# Patient Record
Sex: Female | Born: 1980 | Race: White | Hispanic: No | Marital: Single | Smoking: Former smoker
Health system: Southern US, Community
[De-identification: ages and names within clinical notes are randomized; demographics above are authoritative.]

## PROBLEM LIST (undated history)

## (undated) DIAGNOSIS — I10 Essential (primary) hypertension: Secondary | ICD-10-CM

---

## 2006-02-13 ENCOUNTER — Emergency Department: Payer: Self-pay | Admitting: Emergency Medicine

## 2007-09-01 ENCOUNTER — Emergency Department: Payer: Self-pay | Admitting: Emergency Medicine

## 2008-05-05 ENCOUNTER — Emergency Department: Payer: Self-pay | Admitting: Emergency Medicine

## 2009-01-26 IMAGING — CR DG CHEST 2V
1 series · 2 of 2 positions shown · non-contrast
Comparison: none

REASON FOR EXAM: cough, fever
COMMENTS:

PROCEDURE:     DXR - DXR CHEST PA (OR AP) AND LATERAL  - September 01, 2007  [DATE]
RESULT:     The lungs are clear. The heart and pulmonary vessels are normal.
The bony and mediastinal structures are unremarkable. There is no effusion.
There is no pneumothorax or evidence of congestive failure.

[Series 1: view not recorded · 0.17mm/px · 2 of 2 slices shown]
[im 1/2]
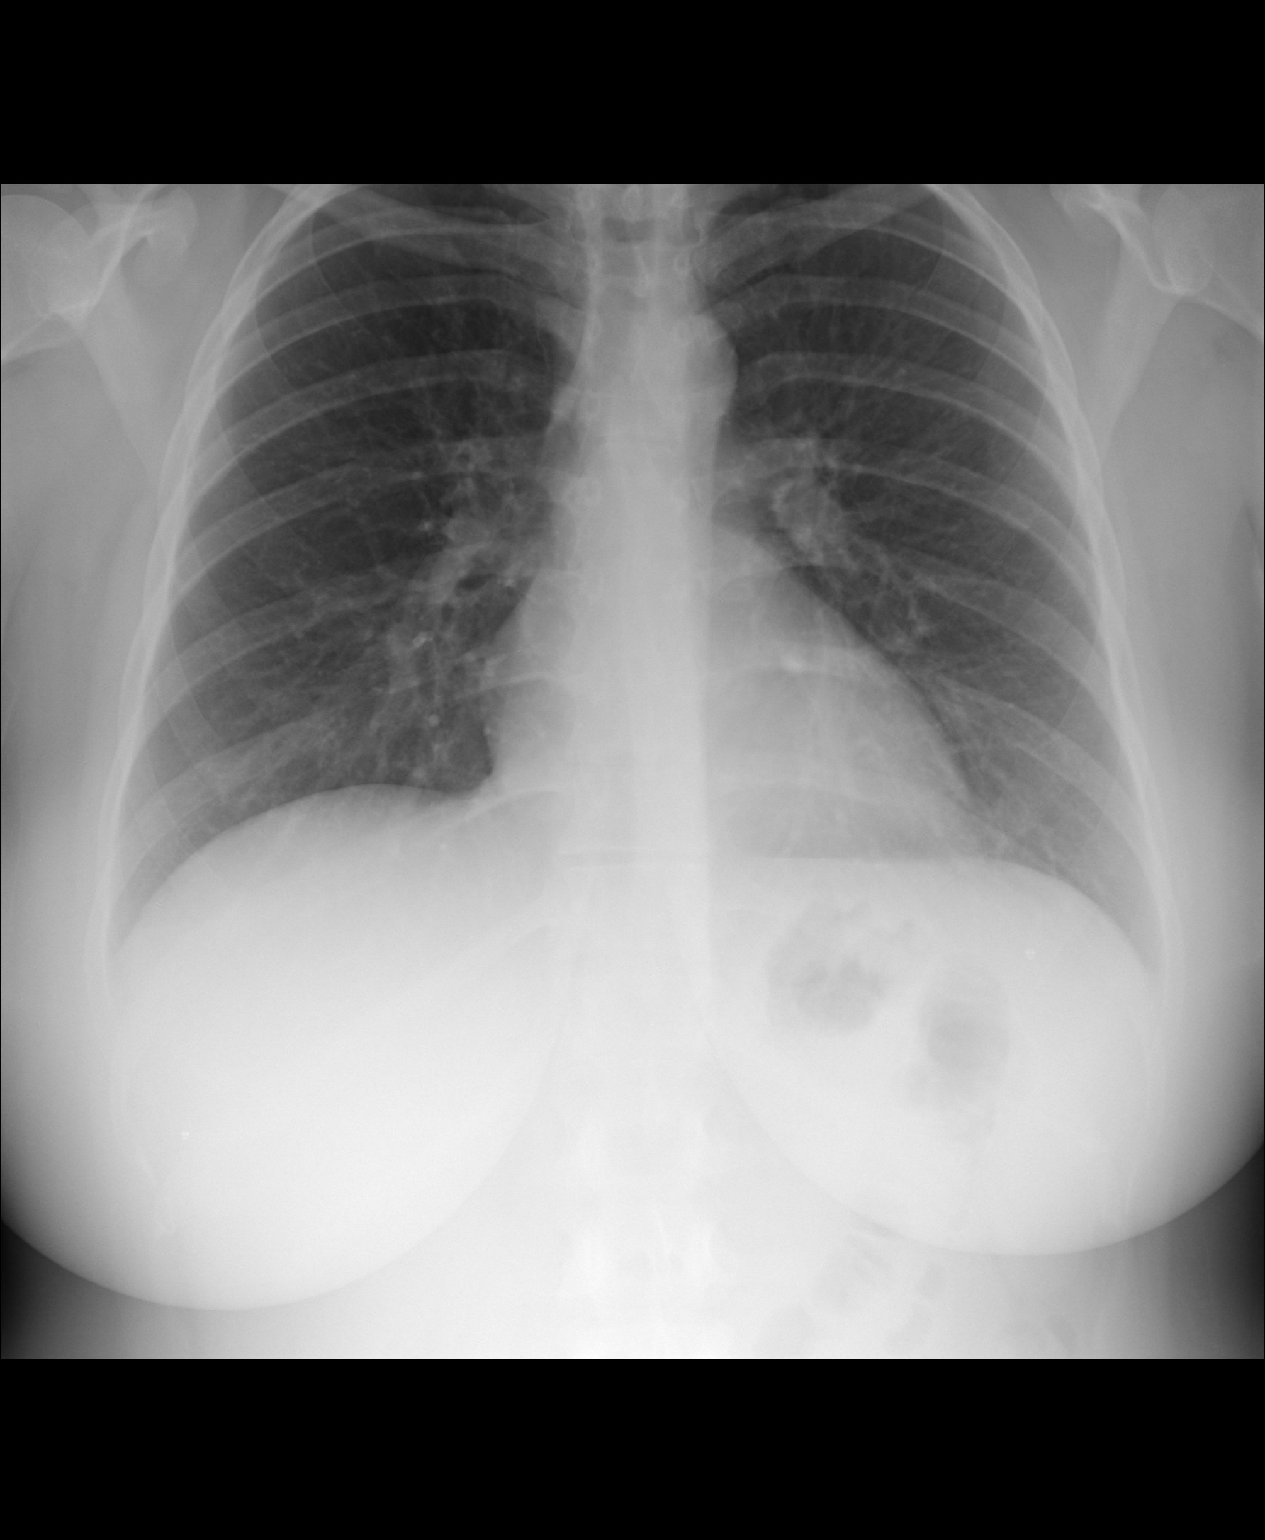
[im 2/2]
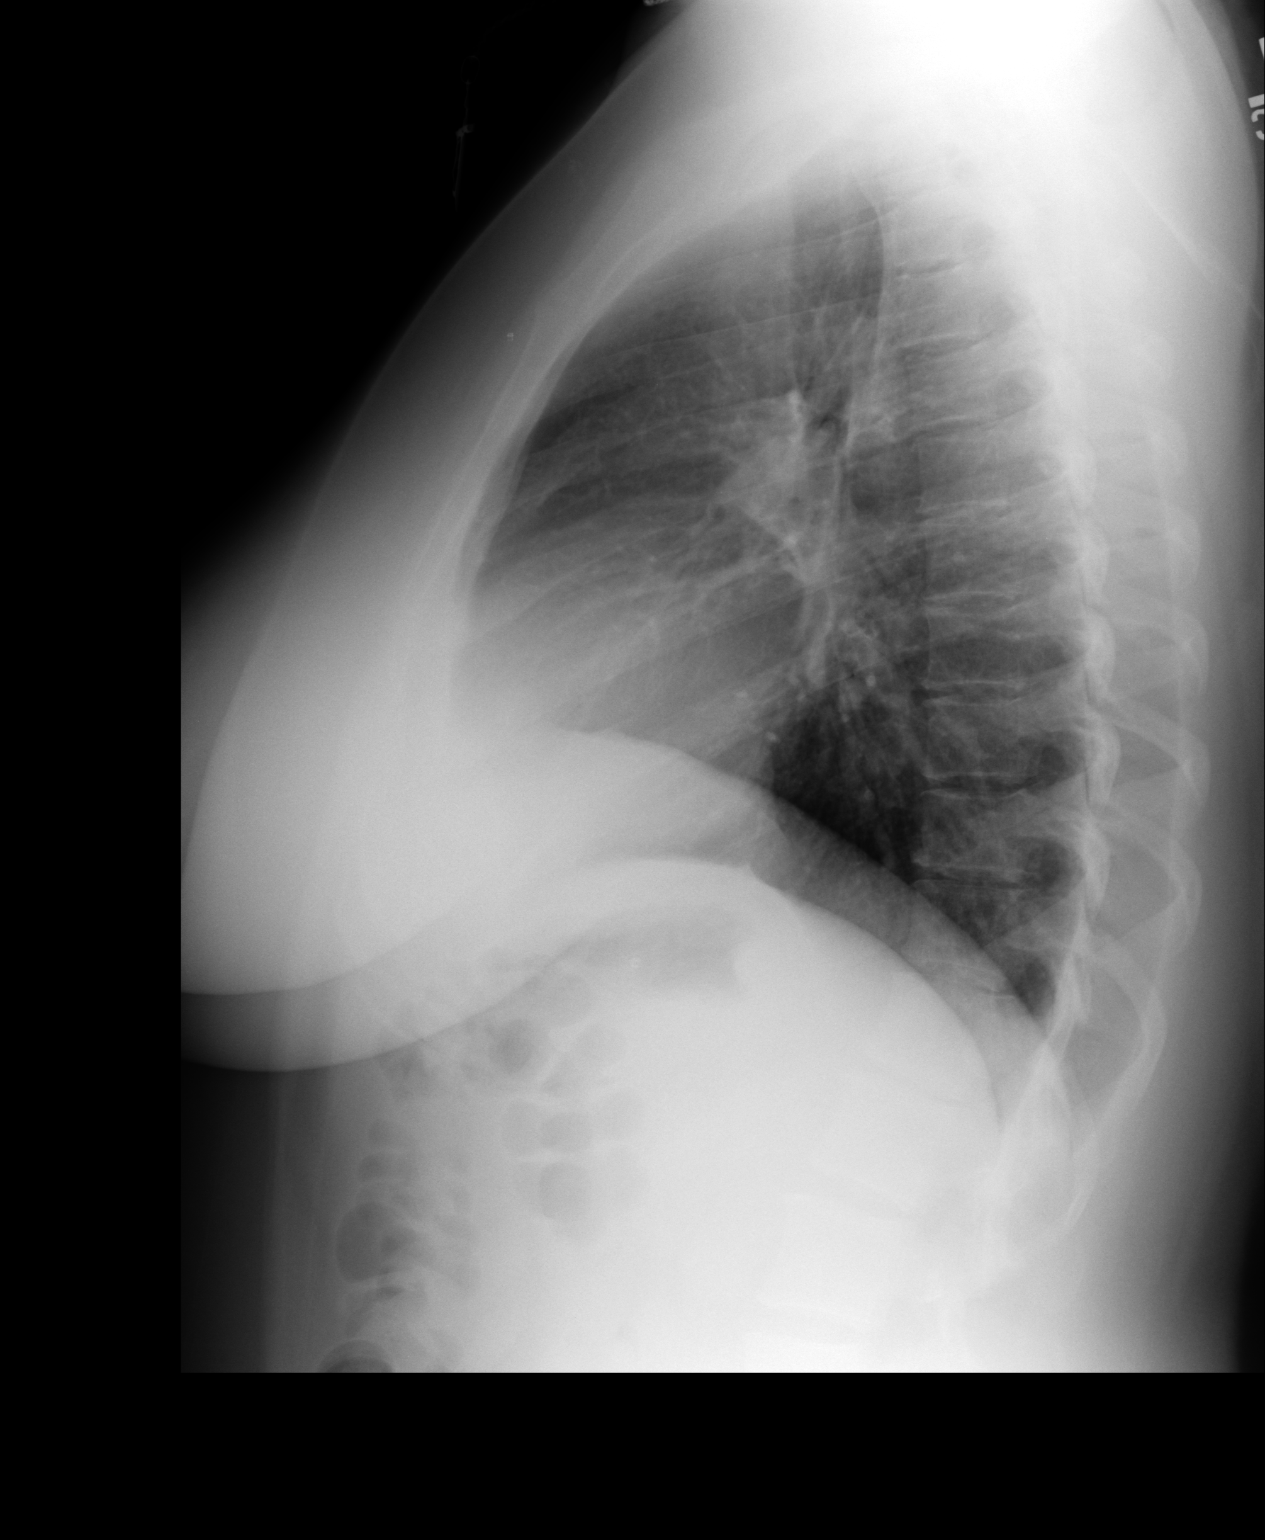

[2 of 2 positions shown; findings below may reference images not displayed]

IMPRESSION: No acute cardiopulmonary disease.

## 2011-11-30 ENCOUNTER — Emergency Department: Payer: Self-pay | Admitting: Emergency Medicine

## 2011-11-30 LAB — CBC
HGB: 12.9 g/dL (ref 12.0–16.0)
MCH: 28.7 pg (ref 26.0–34.0)
MCHC: 33.4 g/dL (ref 32.0–36.0)
MCV: 86 fL (ref 80–100)
Platelet: 304 10*3/uL (ref 150–440)
RBC: 4.49 10*6/uL (ref 3.80–5.20)
RDW: 13.4 % (ref 11.5–14.5)
WBC: 13.7 10*3/uL — ABNORMAL HIGH (ref 3.6–11.0)

## 2011-11-30 LAB — BASIC METABOLIC PANEL
Anion Gap: 13 (ref 7–16)
Co2: 24 mmol/L (ref 21–32)
Creatinine: 0.78 mg/dL (ref 0.60–1.30)
Osmolality: 286 (ref 275–301)
Potassium: 3.2 mmol/L — ABNORMAL LOW (ref 3.5–5.1)
Sodium: 143 mmol/L (ref 136–145)

## 2011-11-30 LAB — PREGNANCY, URINE: Pregnancy Test, Urine: NEGATIVE m[IU]/mL

## 2011-12-21 ENCOUNTER — Emergency Department: Payer: Self-pay | Admitting: Emergency Medicine

## 2011-12-25 ENCOUNTER — Ambulatory Visit: Payer: Self-pay | Admitting: General Practice

## 2011-12-25 LAB — PREGNANCY, URINE: Pregnancy Test, Urine: NEGATIVE m[IU]/mL

## 2011-12-25 LAB — SEDIMENTATION RATE: Erythrocyte Sed Rate: 12 mm/hr (ref 0–20)

## 2011-12-25 LAB — CBC
MCH: 29.3 pg (ref 26.0–34.0)
MCV: 86 fL (ref 80–100)
RBC: 4.68 10*6/uL (ref 3.80–5.20)

## 2013-02-15 ENCOUNTER — Emergency Department: Payer: Self-pay | Admitting: Emergency Medicine

## 2013-02-15 LAB — CBC WITH DIFFERENTIAL/PLATELET
Basophil #: 0 10*3/uL (ref 0.0–0.1)
HCT: 42.2 % (ref 35.0–47.0)
Lymphocyte #: 1.1 10*3/uL (ref 1.0–3.6)
MCH: 29.6 pg (ref 26.0–34.0)
MCHC: 34.9 g/dL (ref 32.0–36.0)
MCV: 85 fL (ref 80–100)
Monocyte %: 8 %
Neutrophil #: 6.5 10*3/uL (ref 1.4–6.5)
Neutrophil %: 77.9 %
Platelet: 274 10*3/uL (ref 150–440)
RDW: 13.9 % (ref 11.5–14.5)
WBC: 8.3 10*3/uL (ref 3.6–11.0)

## 2013-02-27 IMAGING — CT CT STONE STUDY
1 of 2 series · 15 of 32 positions shown, 19 images · non-contrast
Comparison: none

REASON FOR EXAM: L FLANK PAIN
COMMENTS:   May transport without cardiac monitor

PROCEDURE:     CT  - CT ABDOMEN /PELVIS WO (STONE)  - November 30, 2011  [DATE]
RESULT:
TECHNIQUE: Helical noncontrasted 3 mm sections were obtained from the lung
bases through the pubic symphysis.

[Series 2: 3mm soft tissue · axial · 0.77mm/px · z∈[-412,+4]mm · 15 of 153 slices shown, 19 images]
[im 7/153  soft-tissue]
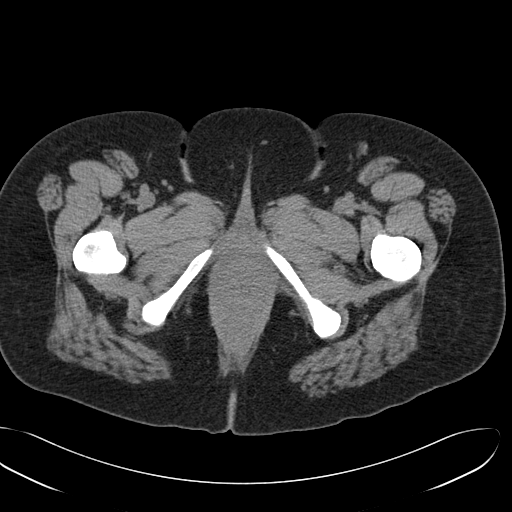
[im 7/153  bone]
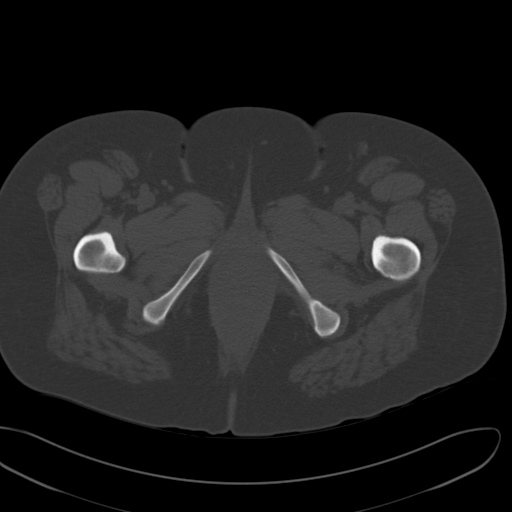
[im 20/153  soft-tissue]
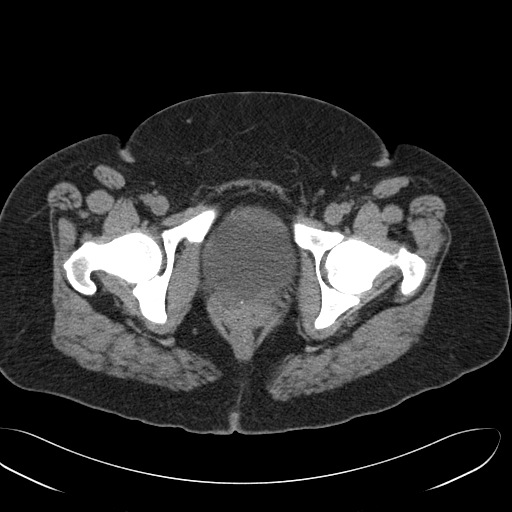
[im 32/153  soft-tissue]
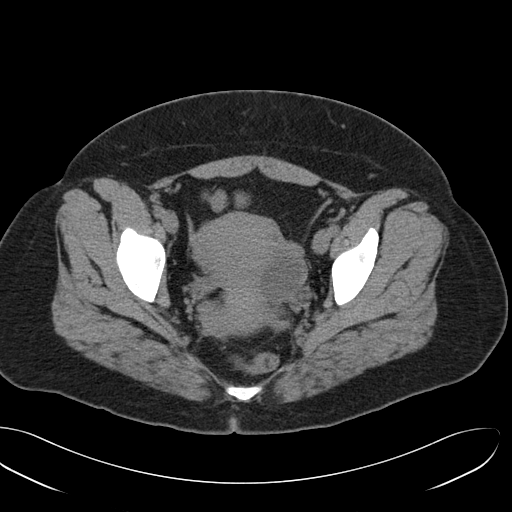
[im 45/153  soft-tissue]
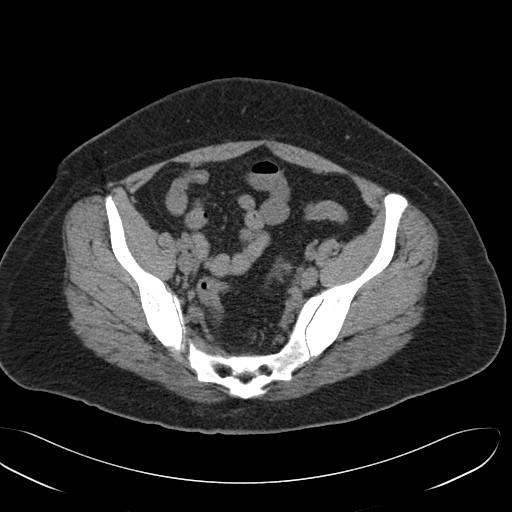
[im 51/153  soft-tissue]
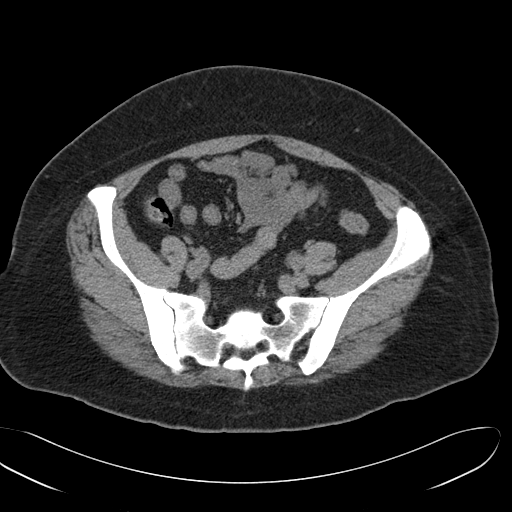
[im 64/153  soft-tissue]
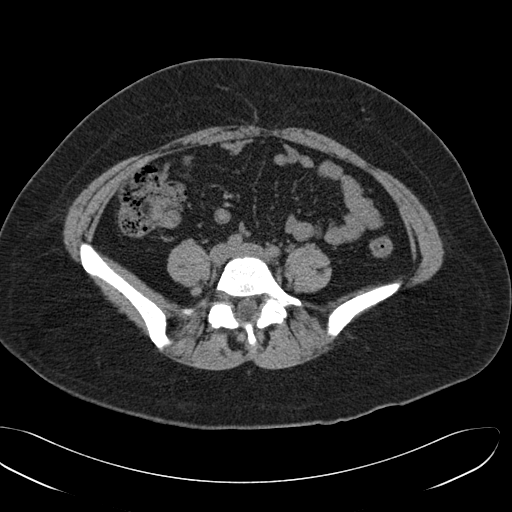
[im 77/153  soft-tissue]
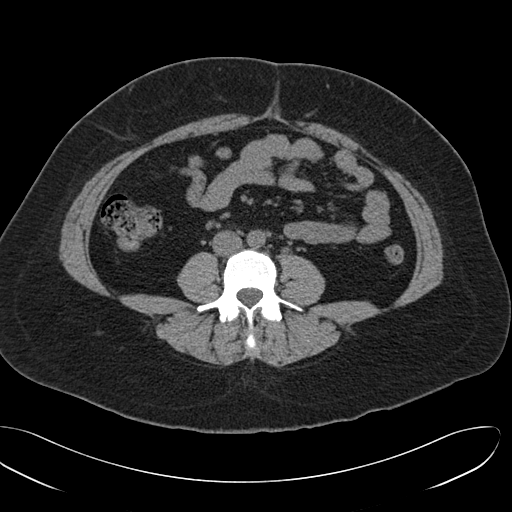
[im 89/153  soft-tissue]
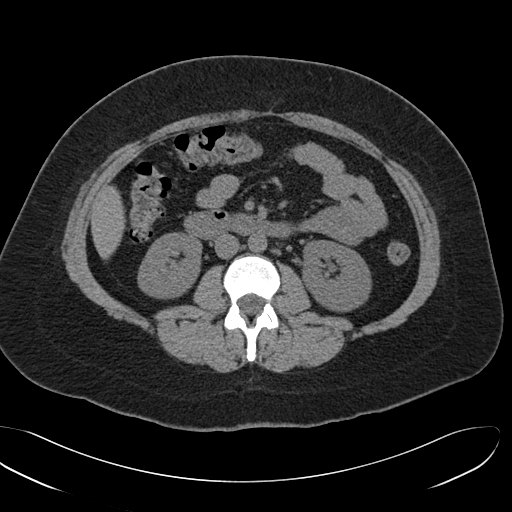
[im 102/153  soft-tissue]
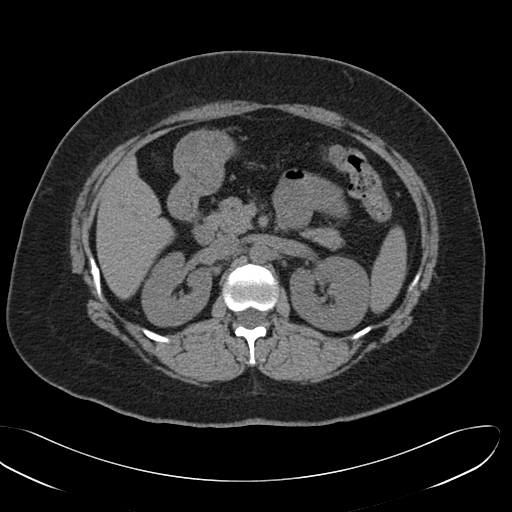
[im 102/153  bone]
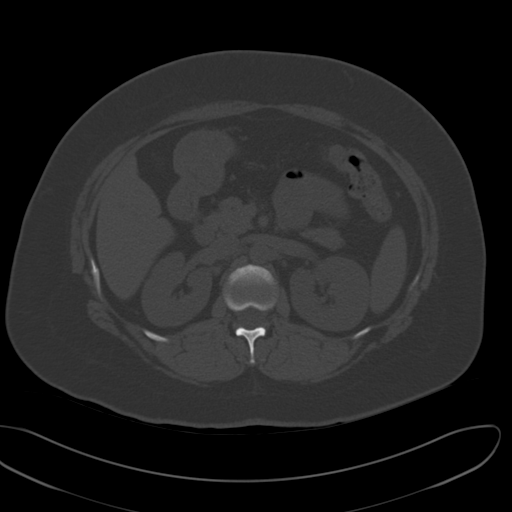
[im 108/153  soft-tissue]
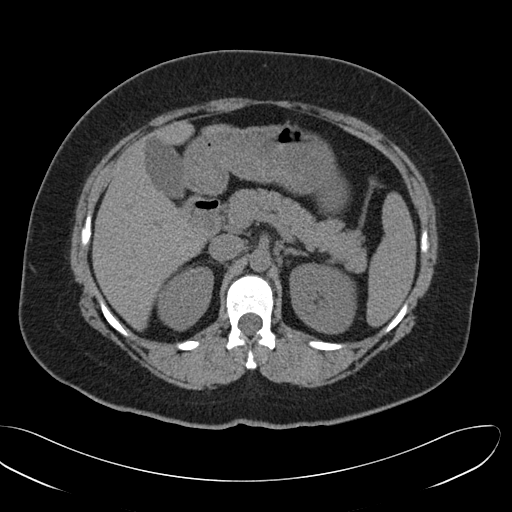
[im 121/153  soft-tissue]
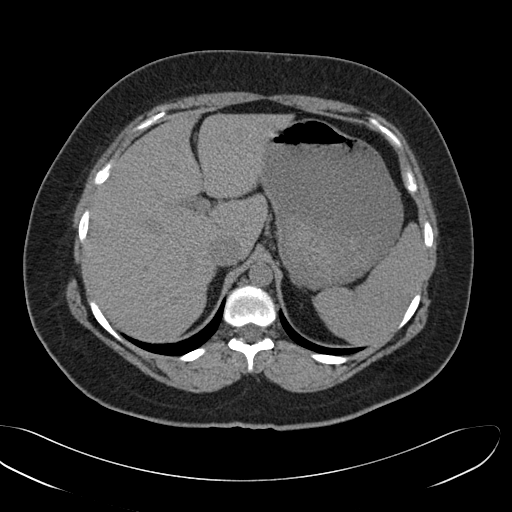
[im 127/153  lung]
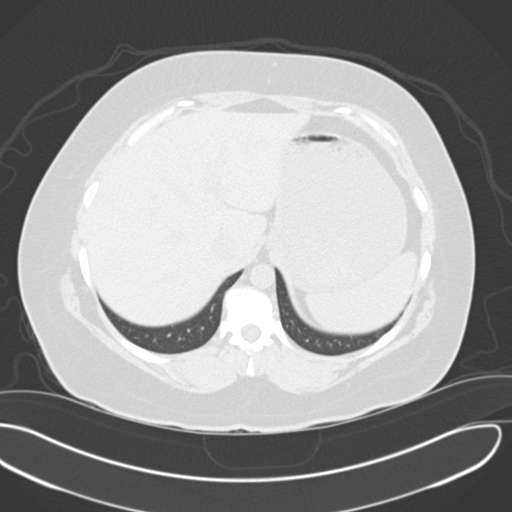
[im 134/153  soft-tissue]
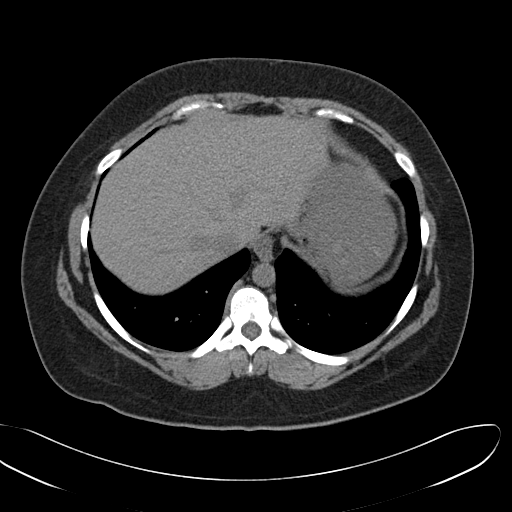
[im 134/153  lung]
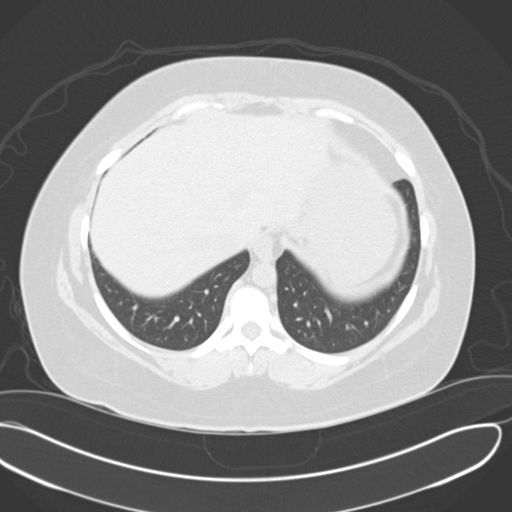
[im 140/153  lung]
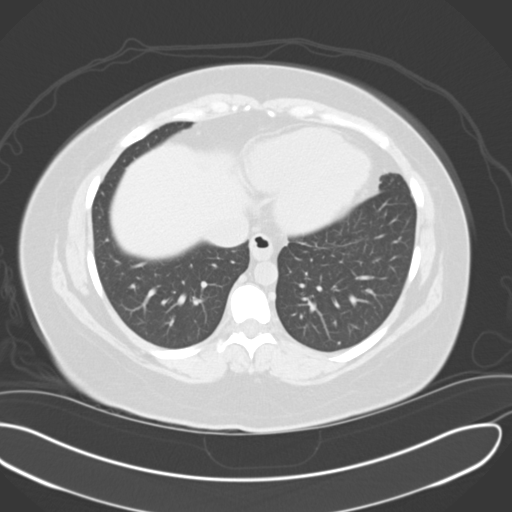
[im 146/153  soft-tissue]
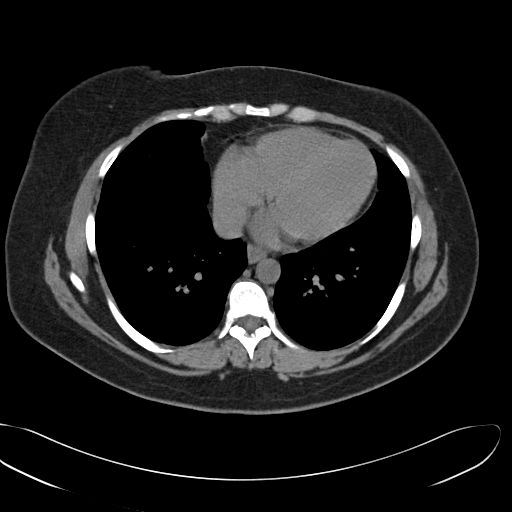
[im 146/153  lung]
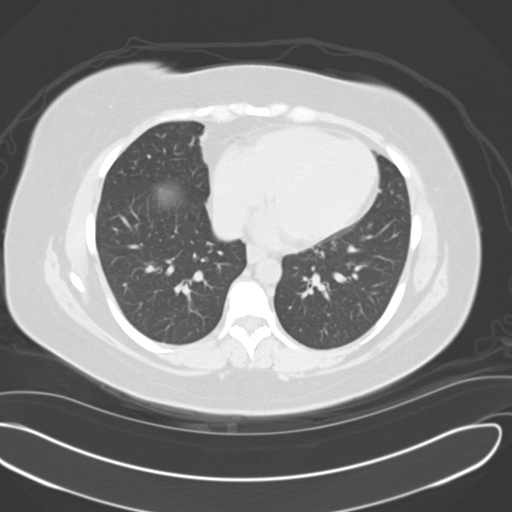

[15 of 32 positions shown; findings below may reference images not displayed]

FINDINGS: The lung bases are unremarkable.

Noncontrast evaluation of the liver, spleen, adrenals, and pancreas are
unremarkable.

There is mild left hydronephrosis and hydroureter, culminating to the
ureterovesical junction. There is a tiny 1 mm calculus in the midline of the
urinary bladder. There is no perinephric edema.

There is a left ovarian cyst measuring approximately 4 cm. There is no
evidence of abdominal or pelvic free fluid, loculated fluid collections nor
evidence of an abdominal aortic aneurysm.
IMPRESSION: 1. Interval passage of left ureteral calculus with residual mild
hydronephrosis and hydroureter.
2. Left ovarian cyst.
3. Dr. Ruggieri of the Emergency Department was informed of these findings via a
preliminary faxed report.

## 2013-05-17 IMAGING — CR LEFT MIDDLE FINGER 2+V
1 series · 3 of 3 positions shown · non-contrast
Comparison: none

REASON FOR EXAM: bit by dog - missing distal tip - eval for fracture or
bone loss
COMMENTS:

[Series 1: x finger pa left · 0.14mm/px · 3 of 3 slices shown]
[im 1/3]
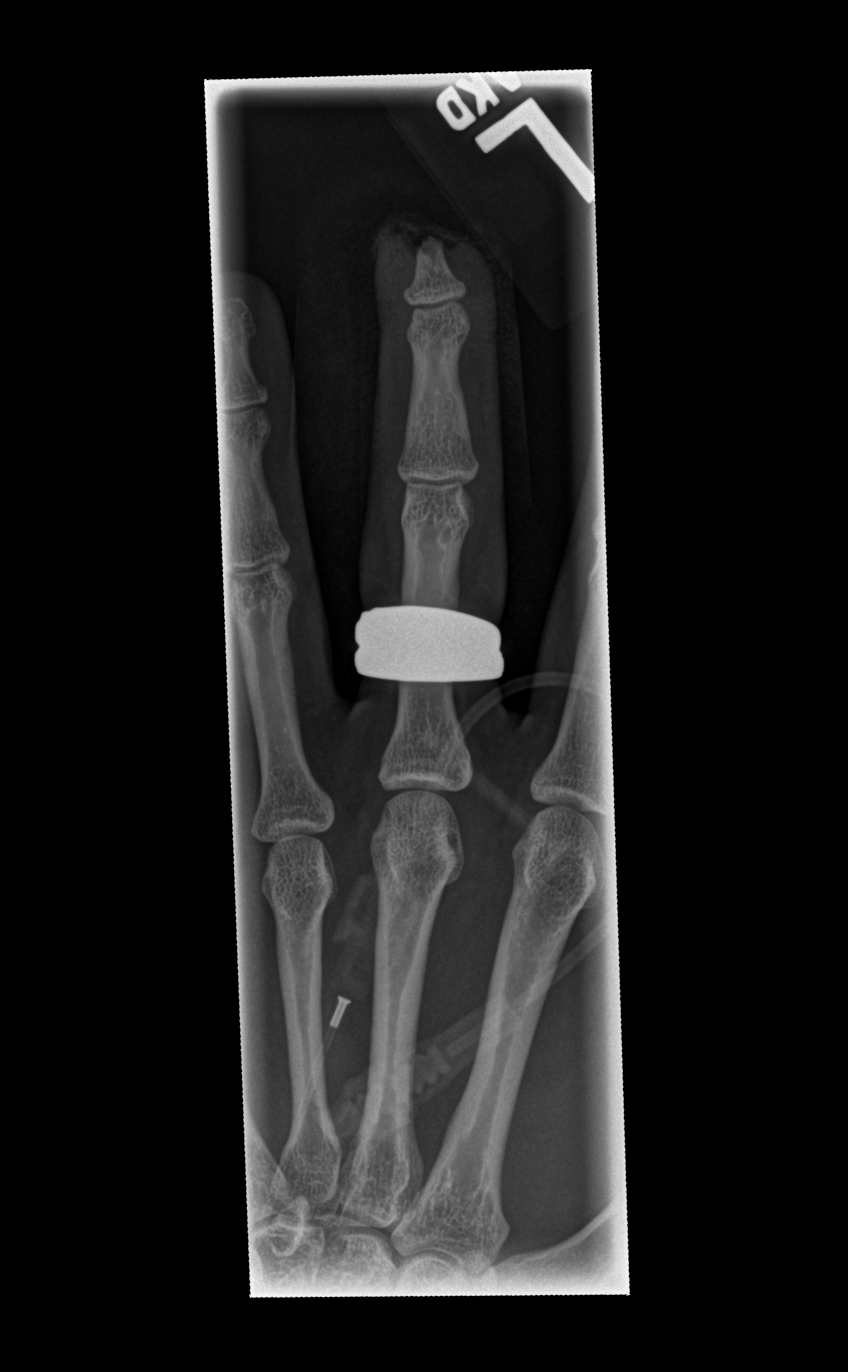
[im 2/3]
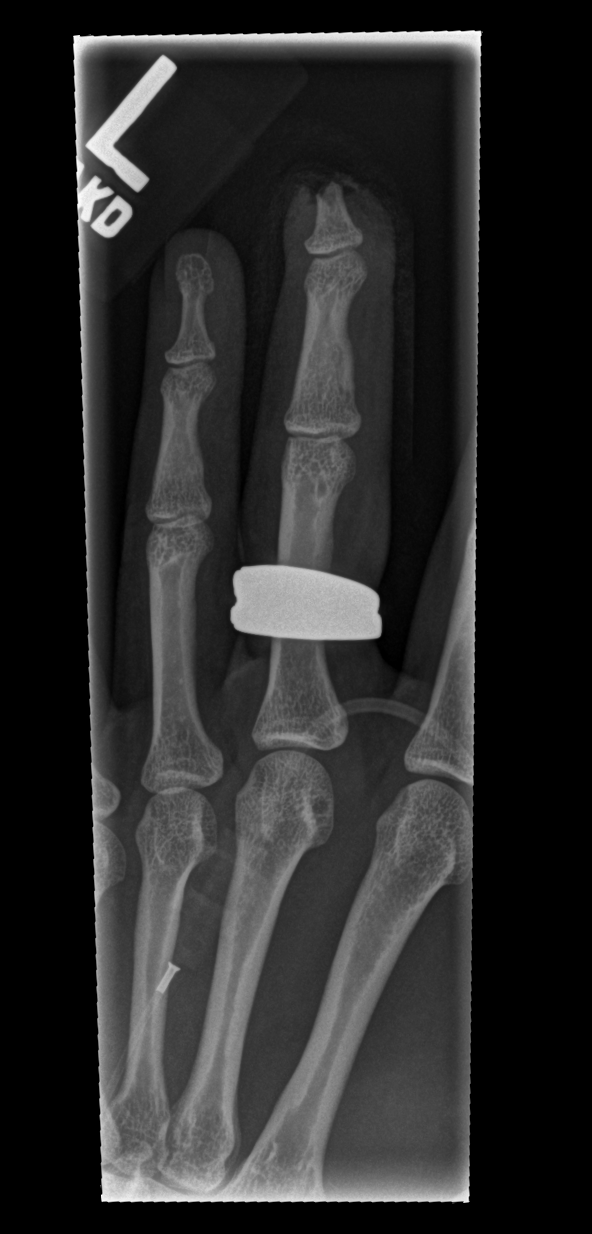
[im 3/3]
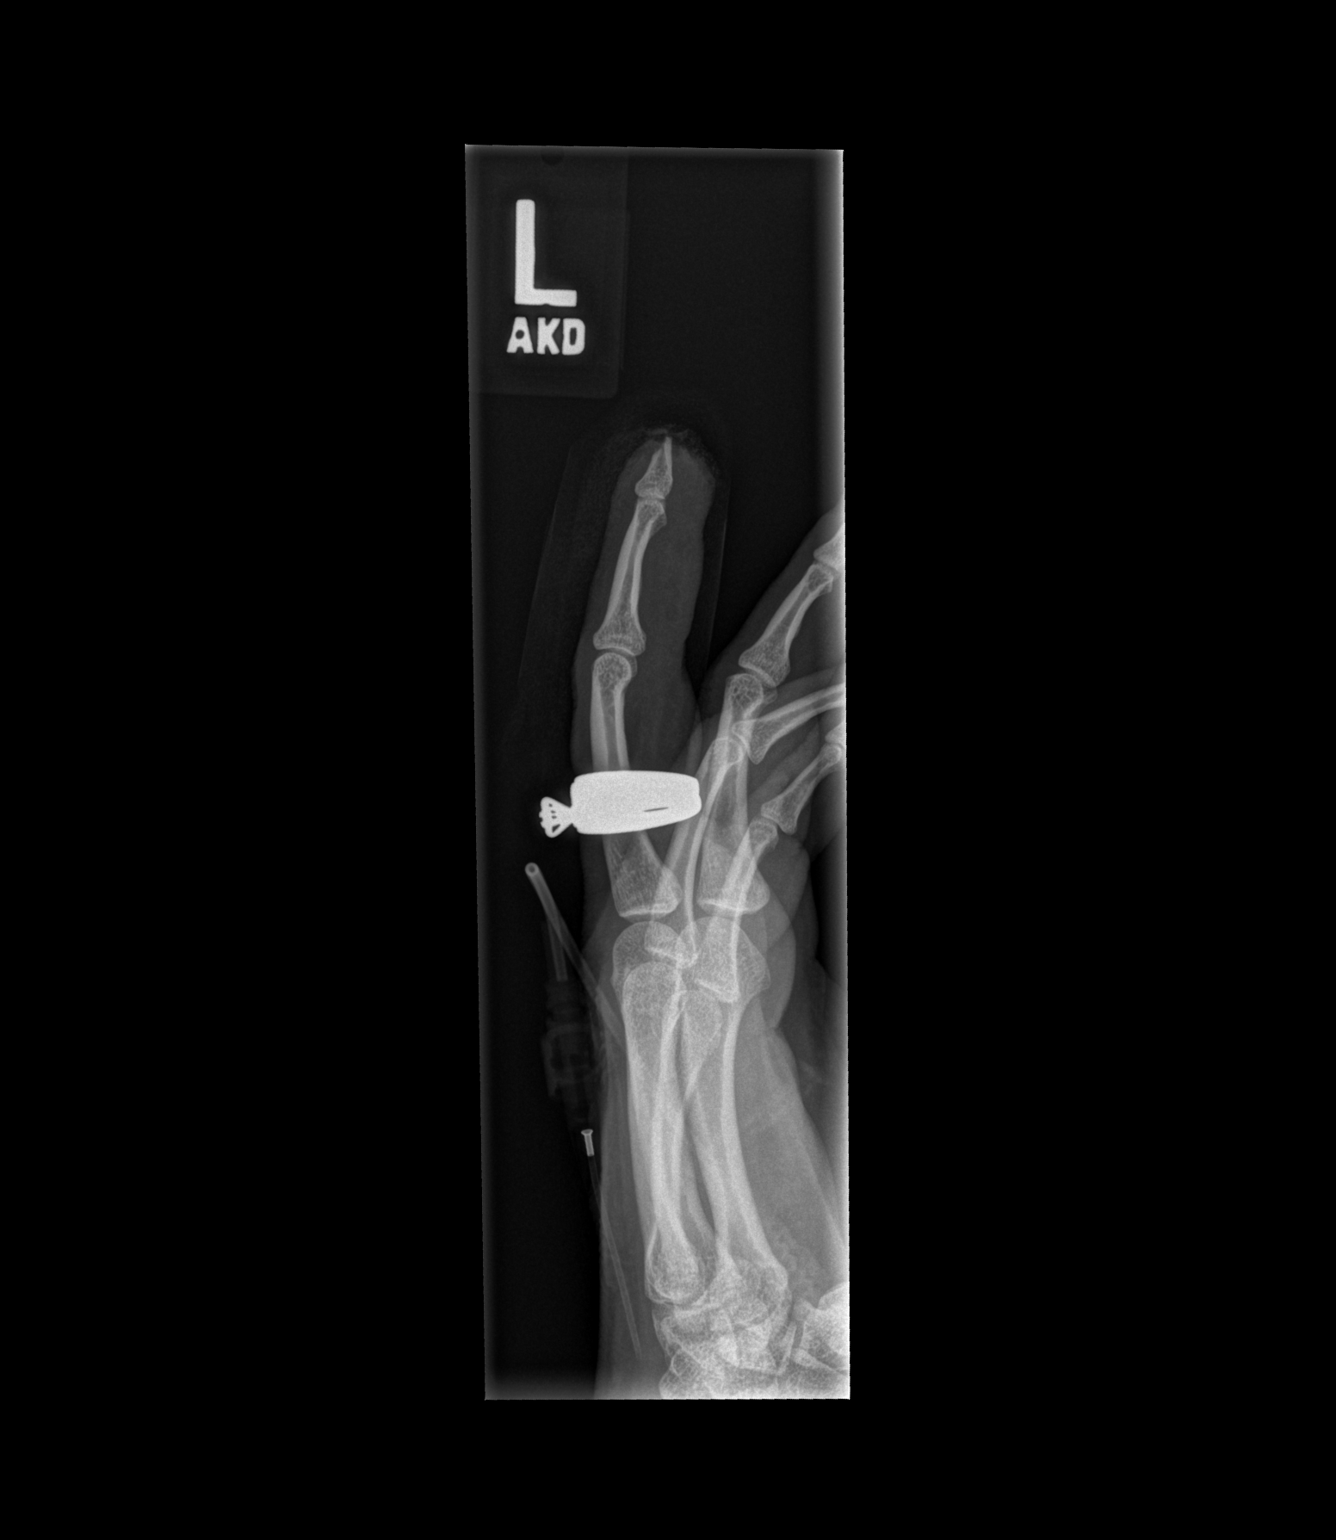

[3 of 3 positions shown; findings below may reference images not displayed]

PROCEDURE:     DXR - DXR FINGER MID 3RD DIGIT LT HAND  - December 21, 2011  [DATE]

RESULT:     Three views of the left third finger reveals the patient to have
undergone partial amputation of the distal phalanx. The tuft of the distal
phalanx and a portion of the distal shaft is absent. There is loss of the
overlying soft tissues. A metallic ring is present.
IMPRESSION: The patient has sustained partial amputation of the distal
phalanx of the left third finger.

[REDACTED]

## 2014-10-30 NOTE — Op Note (Signed)
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    PROCEDURE PERFORMED: Revision of traumatic amputation through the distal phalanx of the left long finger.   SURGEON: Illene LabradorJames P. Angie FavaHooten, Jr., MD   ANESTHESIA: General.   ESTIMATED BLOOD LOSS: Minimal.   TOURNIQUET TIME: Not used.   DRAINS: None.   INDICATIONS FOR SURGERY: The patient is a 34 year old female who sustained a traumatic amputation of the distal aspect of the left long finger when she was bit by her pet pit bull dog. The wound was initially cleaned in the Emergency Room and she subsequently presented to the office for definitive care. The distal fragment of the distal phalanx was visible within the bed of the wound. The nail and nail bed had been completely amputated. After discussion of the risks and benefits of surgical intervention, the patient expressed her understanding of the risks and benefits and agreed with plans for surgical intervention.   PROCEDURE IN DETAIL: The patient was brought in the operating room and, after adequate general anesthesia was achieved, a tourniquet was placed on the patient's upper left arm. The patient's left hand and arm were cleaned and prepped with Betadine and draped in the usual sterile fashion. A time-out was performed as per usual protocol. Loupe magnification was used throughout the procedure. A Freer elevator was used to elevate the tissue off of the remnant of the distal phalanx of the left long finger. The distal phalanx was then debrided and osteoectomy was performed using a rongeur so as to allow for good soft tissue coverage. Remnant of the base of the distal phalanx was left intact. The end of the bone was inspected and was smoothed  accordingly. Soft tissue was sharply debrided using the scalpel. Provisional soft tissue closure was performed using interrupted sutures of #5-0 nylon. This allowed for closure over the distal phalanx and allowed for appropriate contouring of the distal aspect of the digit. The wound was again irrigated. 5 mL of 0.5% Marcaine was used as a combination of a digital block and local block. A sterile dressing was applied.   The patient tolerated the procedure well. She was transported to the recovery room in stable condition.   ____________________________ Illene LabradorJames P. Angie FavaHooten Jr., MD jph:drc D: 12/25/2011 23:58:57 ET T: 12/26/2011 10:00:47 ET JOB#: [id number]  cc: Illene LabradorJames P. Angie FavaHooten Jr., MD, <Dictator> Illene LabradorJAMES P Angie FavaHOOTEN JR MD ELECTRONICALLY SIGNED 12/27/2011 12:43

## 2014-11-21 ENCOUNTER — Encounter: Payer: Self-pay | Admitting: General Practice

## 2014-11-21 ENCOUNTER — Emergency Department
Admission: EM | Admit: 2014-11-21 | Discharge: 2014-11-21 | Disposition: A | Discharge status: Home / Self Care | Payer: Self-pay | Attending: Emergency Medicine | Admitting: Emergency Medicine

## 2014-11-21 DIAGNOSIS — K0889 Other specified disorders of teeth and supporting structures: Secondary | ICD-10-CM

## 2014-11-21 DIAGNOSIS — K088 Other specified disorders of teeth and supporting structures: Secondary | ICD-10-CM | POA: Insufficient documentation

## 2014-11-21 DIAGNOSIS — Y9389 Activity, other specified: Secondary | ICD-10-CM | POA: Insufficient documentation

## 2014-11-21 DIAGNOSIS — Y998 Other external cause status: Secondary | ICD-10-CM | POA: Insufficient documentation

## 2014-11-21 DIAGNOSIS — T148 Other injury of unspecified body region: Secondary | ICD-10-CM | POA: Insufficient documentation

## 2014-11-21 DIAGNOSIS — Z87891 Personal history of nicotine dependence: Secondary | ICD-10-CM | POA: Insufficient documentation

## 2014-11-21 DIAGNOSIS — Y9289 Other specified places as the place of occurrence of the external cause: Secondary | ICD-10-CM | POA: Insufficient documentation

## 2014-11-21 DIAGNOSIS — T148XXA Other injury of unspecified body region, initial encounter: Secondary | ICD-10-CM

## 2014-11-21 DIAGNOSIS — X58XXXA Exposure to other specified factors, initial encounter: Secondary | ICD-10-CM | POA: Insufficient documentation

## 2014-11-21 DIAGNOSIS — M62838 Other muscle spasm: Secondary | ICD-10-CM | POA: Insufficient documentation

## 2014-11-21 MED ORDER — HYDROCODONE-ACETAMINOPHEN 5-325 MG PO TABS: 1.0000 | ORAL_TABLET | Freq: Four times a day (QID) | ORAL | Status: DC | PRN

## 2014-11-21 MED ORDER — AMOXICILLIN 500 MG PO CAPS
500.0000 mg | ORAL_CAPSULE | Freq: Three times a day (TID) | ORAL | Status: DC
Start: 2014-11-21 — End: 2014-11-21

## 2014-11-21 MED ORDER — CYCLOBENZAPRINE HCL 10 MG PO TABS: 10.0000 mg | ORAL_TABLET | Freq: Three times a day (TID) | ORAL | Status: AC | PRN

## 2014-11-21 MED ORDER — CEPHALEXIN 500 MG PO CAPS: 500.0000 mg | ORAL_CAPSULE | Freq: Four times a day (QID) | ORAL | Status: AC

## 2014-11-21 MED ORDER — IBUPROFEN 800 MG PO TABS: 800.0000 mg | ORAL_TABLET | Freq: Three times a day (TID) | ORAL | Status: DC | PRN

## 2014-11-21 NOTE — Discharge Instructions (Signed)
OPTIONS FOR DENTAL FOLLOW UP CARE ° °[place] [facility] and [facility] - Local Safety Net Dental Clinics °http://www.ncdhhs.gov/dph/oralhealth/services/safetynetclinics.htm °  °[facility] (336-562-3123) ° °[facility] [place] (919-933-9087) ° °[facility] ([phone] ext [phone]) ° °[facility] ([phone]) ° °[facility] ([phone]) °This clinic caters to the indigent population and is on a lottery system. °Location: °[facility], [address], [place] °Clinic Hours: °Wednesdays from 6pm - 9pm, patients seen by a lottery system. °For dates, call or go to www.med.[facility].edu/[facility]/patients/Dental-[facility] °Services: °Cleanings, fillings and simple extractions. °Payment Options: °DENTAL WORK IS FREE OF CHARGE. Bring proof of income or support. °Best way to get seen: °Arrive at 5:15 pm - this is a lottery, NOT first come/first serve, so arriving earlier will not increase your chances of being seen. °  °  °[facility] °[phone] °Select option 1 for emergencies °  °Location: °[facility], [address], [place] °Clinic Hours: °No walk-ins accepted - call the day before to schedule an appointment. °Check in times are 9:30 am and 1:30 pm. °Services: °Simple extractions, temporary fillings, pulpectomy/pulp debridement, uncomplicated abscess drainage. °Payment Options: °PAYMENT IS DUE AT THE TIME OF SERVICE.  Fee is usually $100-200, additional surgical procedures (e.g. abscess drainage) may be extra. °Cash, checks, Visa/MasterCard accepted.  Can file Medicaid if patient is covered for dental - patient should call case worker to check. °No discount for [facility] patients. °Best way to get seen: °MUST call the day before and get onto the schedule. Can usually be seen the next 1-2 days. No walk-ins accepted. °  °  °[facility] °[phone] °   °Location: °[facility], [address], [place] °Clinic Hours: °M, W, Th, F 8am or 1:30pm, Tues 9a or 1:30 - first come/first served. °Services: °Simple extractions, temporary fillings, uncomplicated abscess drainage.  You do not need to be an [place] resident. °Payment Options: °PAYMENT IS DUE AT THE TIME OF SERVICE. °Dental insurance, otherwise sliding scale - bring proof of income or support. °Depending on income and treatment needed, cost is usually $50-200. °Best way to get seen: °Arrive early as it is first come/first served. °  °  °[facility] °[phone] °  °Location: °[address] °Clinic Hours: °Mon-Thu 8a-5p °Services: °Most basic dental services including extractions and fillings. °Payment Options: °PAYMENT IS DUE AT THE TIME OF SERVICE. °Sliding scale, up to 50% off - bring proof if income or support. °Medicaid with dental option accepted. °Best way to get seen: °Call to schedule an appointment, can usually be seen within 2 weeks OR they will try to see walk-ins - show up at 8a or 2p (you may have to wait). °  °  °[facility] °919-245-2435 °[place] RESIDENTS ONLY °  °Location: °[facility], [address], [place], [place] [address] °Clinic Hours: By appointment only. °Monday - Thursday 8am-5pm, Friday 8am-12pm °Services: Cleanings, fillings, extractions. °Payment Options: °PAYMENT IS DUE AT THE TIME OF SERVICE. °Cash, Visa or MasterCard. Sliding scale - $30 minimum per service. °Best way to get seen: °Come in to office, complete packet and make an appointment - need proof of income °or support monies for each household member and proof of [place] residence. °Usually takes about a month to get in. °  °  °[facility] °[phone] °  °Location: °[address]   [place] °Clinic Hours: Walk-in Urgent Care Dental Services are offered Monday-Friday  mornings only. °The numbers of emergencies accepted daily is limited to the number of °providers available. °Maximum 15 - Mondays, Wednesdays & Thursdays °Maximum 10 - Tuesdays & Fridays °Services: °You do not need to be a [place] resident to be seen for a dental emergency. °Emergencies are defined as pain, swelling, abnormal bleeding, or dental trauma. Walkins will receive x-rays if needed. °NOTE: Dental cleaning is not an emergency. °Payment Options: °PAYMENT IS DUE AT THE TIME OF SERVICE. °Minimum co-pay is $40.00 for uninsured patients. °Minimum co-pay is $3.00 for Medicaid with dental coverage. °Dental Insurance is accepted and must be presented at time of visit. °Medicare does not cover dental. °Forms of payment: Cash, credit card, checks. °Best way to get seen: °If not previously registered with the clinic, walk-in dental registration begins at 7:15 am and is on a first come/first serve basis. °If previously registered with the clinic, call to make an appointment. °  °  °[facility] °[phone] °[place] RESIDENTS ONLY °  °Location: °[address], [place], [place] °Clinic Hours: °Mon-Thu 10a-2p °Services: Extractions only! °Payment Options: °FREE (donations accepted) - bring proof of income or support °Best way to get seen: °Call and schedule an appointment OR come at 8am on the 1st Monday of every month (except for holidays) when it is first come/first served. °  °  °[facility] °[phone] °  °Location: °[address], [place] °Clinic Hours: °Friday mornings °Services, Payment Options, Best way to get seen: °Call for info °

## 2014-11-21 NOTE — ED Provider Notes (Signed)
[facility] Emergency Department Provider Note  ____________________________________________  Time seen: Approximately 1850 I have reviewed the triage vital signs and the nursing notes.   HISTORY  Chief Complaint Dental Pain    HPI Erika Foley is a 34 y.o. female complaining of right sided mandibular angle and jaw pain as well as right trapezius pains that she pulled a muscle at work on top of having a tooth pulled it was wisdom tooth of the right lower jaw and is now developed pain and inflammation to the area has been unable to get back in 2 dentist and has an appointment for Friday but is in a lot of pain per her any type of chewing and biting cold or hot makes it worse nothing seems to currently improve it as far as the muscle pain goes any type of liftingor movement seems to flareup the cramping and spasming of her muscle rest makes it better   History reviewed. No pertinent past medical history.  There are no active problems to display for this patient.   History reviewed. No pertinent past surgical history.  Current Outpatient Rx  Name  Route  Sig  Dispense  Refill  . amoxicillin (AMOXIL) 500 MG capsule   Oral   Take 1 capsule (500 mg total) by mouth 3 (three) times daily.   21 capsule   0   . cyclobenzaprine (FLEXERIL) 10 MG tablet   Oral   Take 1 tablet (10 mg total) by mouth every 8 (eight) hours as needed for muscle spasms.   12 tablet   1   . HYDROcodone-acetaminophen (NORCO) 5-325 MG per tablet   Oral   Take 1 tablet by mouth every 6 (six) hours as needed for moderate pain.   10 tablet   0   . ibuprofen (ADVIL,MOTRIN) 800 MG tablet   Oral   Take 1 tablet (800 mg total) by mouth every 8 (eight) hours as needed.   30 tablet   0     Allergies Review of patient's allergies indicates no known allergies.  No family history on file.  Social History History  Substance Use Topics  . Smoking status: Former Games developermoker  . Smokeless  tobacco: Never Used  . Alcohol Use: No    Review of Systems Constitutional: No fever/chills Eyes: No visual changes. ENT: No sore throat. Cardiovascular: Denies chest pain. Respiratory: Denies shortness of breath. Gastrointestinal: No abdominal pain.  No nausea, no vomiting.  No diarrhea.  No constipation. Genitourinary: Negative for dysuria. Musculoskeletal: Negative for back pain. Skin: Negative for rash. Neurological: Negative for headaches, focal weakness or numbness.  10-point ROS otherwise negative.  ____________________________________________   PHYSICAL EXAM:  VITAL SIGNS: ED Triage Vitals  Enc Vitals Group     BP 11/21/14 1813 160/98 mmHg     Pulse Rate 11/21/14 1811 80     Resp 11/21/14 1811 18     Temp 11/21/14 1811 98 F (36.7 C)     Temp Source 11/21/14 1811 Oral     SpO2 11/21/14 1811 98 %     Weight 11/21/14 1811 160 lb (72.576 kg)     Height 11/21/14 1811 5\' 1"  (1.549 m)     Head Cir --      Peak Flow --      Pain Score 11/21/14 1812 10     Pain Loc --      Pain Edu? --      Excl. in GC? --  Constitutional: Alert and oriented. Well appearing and in no acute distress. Eyes: Conjunctivae are normal. PERRL. EOMI. Head: Atraumatic. Nose: No congestion/rhinnorhea. Mouth/Throat: Mucous membranes are moist.  Oropharynx non-erythematous. Does have inflamed gums the posterior aspect of the right side of her mandible Neck: No stridor.  Cardiovascular: Normal rate, regular rhythm. Grossly normal heart sounds.  Good peripheral circulation. Respiratory: Normal respiratory effort.  No retractions. Lungs CTAB. }Musculoskeletal: No lower extremity tenderness nor edema.  No joint effusions. Tight spasm trapezius muscle on the right side otherwise a full range of motion no functional deficit Neurologic:  Normal speech and language. No gross focal neurologic deficits are appreciated. Speech is normal. No gait instability. Skin:  Skin is warm, dry and intact. No  rash noted. Psychiatric: Mood and affect are normal. Speech and behavior are normal.  ____________________________________________     PROCEDURES  Procedure(s) performed: None  Critical Care performed: No  ____________________________________________   INITIAL IMPRESSION / ASSESSMENT AND PLAN / ED COURSE  Pertinent labs & imaging results that were available during my care of the patient were reviewed by me and considered in my medical decision making (see chart for details).  Dental pain and inflammation posterior extraction concern for dry socket as well as trapezius muscle strain and spasm patient's to follow up with a crit no clinic for further concern about her muscle strain and to follow-up with a dentist as soon as possible for tooth was started on antibiotics anti-inflammatories medication for pain ____________________________________________   FINAL CLINICAL IMPRESSION(S) / ED DIAGNOSES  Final diagnoses:  Pain, dental  Muscle spasms of neck  Muscle strain     Takako Minckler Rosalyn GessWilliam C Carlicia Leavens, PA-C 11/21/14 1915  Sharyn CreamerMark Quale, MD 12/03/14 [phone]

## 2014-11-21 NOTE — ED Notes (Signed)
Pt. Arrived to ED from home with reprots of about three weeks ago she started experiencing right side dental pain that is now radiating down right ear and jaw. PT alert and oriented.

## 2014-11-21 NOTE — ED Notes (Signed)
Pt reports toothache to right lower jaw.  States her dentist wont she her.  No resp distress.  Also reports pain to right shoulder x 4 days.  Denies injury, states it hurts to move and sleep.  .  Skin w/d

## 2014-12-01 ENCOUNTER — Encounter: Payer: Self-pay | Admitting: Emergency Medicine

## 2014-12-01 ENCOUNTER — Emergency Department
Admission: EM | Admit: 2014-12-01 | Discharge: 2014-12-01 | Discharge status: Against Medical Advice | Payer: Self-pay | Attending: Emergency Medicine | Admitting: Emergency Medicine

## 2014-12-01 DIAGNOSIS — K088 Other specified disorders of teeth and supporting structures: Secondary | ICD-10-CM | POA: Insufficient documentation

## 2014-12-01 DIAGNOSIS — Z87891 Personal history of nicotine dependence: Secondary | ICD-10-CM | POA: Insufficient documentation

## 2014-12-01 NOTE — ED Notes (Signed)
Patient ambulatory to triage with steady gait, without difficulty or distress noted; pt reports right sided toothache x 3wks

## 2020-05-20 ENCOUNTER — Ambulatory Visit: Payer: Self-pay

## 2022-11-19 DIAGNOSIS — G43719 Chronic migraine without aura, intractable, without status migrainosus: Secondary | ICD-10-CM | POA: Diagnosis not present

## 2022-11-19 DIAGNOSIS — Z79899 Other long term (current) drug therapy: Secondary | ICD-10-CM | POA: Diagnosis not present

## 2022-11-19 DIAGNOSIS — Z049 Encounter for examination and observation for unspecified reason: Secondary | ICD-10-CM | POA: Diagnosis not present

## 2022-11-21 ENCOUNTER — Other Ambulatory Visit (HOSPITAL_COMMUNITY): Payer: Self-pay

## 2022-11-21 ENCOUNTER — Encounter (HOSPITAL_COMMUNITY): Payer: Self-pay | Admitting: *Deleted

## 2022-11-21 ENCOUNTER — Encounter (HOSPITAL_COMMUNITY)
Admission: EM | Disposition: A | Discharge status: Home / Self Care | Payer: Self-pay | Source: Home / Self Care | Attending: Emergency Medicine

## 2022-11-21 ENCOUNTER — Other Ambulatory Visit: Payer: Self-pay

## 2022-11-21 ENCOUNTER — Emergency Department (HOSPITAL_COMMUNITY): Payer: BC Managed Care – PPO

## 2022-11-21 ENCOUNTER — Emergency Department (HOSPITAL_COMMUNITY): Payer: BC Managed Care – PPO | Admitting: Anesthesiology

## 2022-11-21 ENCOUNTER — Observation Stay (HOSPITAL_COMMUNITY)
Admission: EM | Admit: 2022-11-21 | Discharge: 2022-11-22 | Disposition: A | Discharge status: Home / Self Care | Payer: BC Managed Care – PPO | Attending: Surgery | Admitting: Surgery

## 2022-11-21 DIAGNOSIS — I10 Essential (primary) hypertension: Secondary | ICD-10-CM | POA: Insufficient documentation

## 2022-11-21 DIAGNOSIS — K8012 Calculus of gallbladder with acute and chronic cholecystitis without obstruction: Secondary | ICD-10-CM | POA: Diagnosis not present

## 2022-11-21 DIAGNOSIS — R1011 Right upper quadrant pain: Secondary | ICD-10-CM

## 2022-11-21 DIAGNOSIS — Z79899 Other long term (current) drug therapy: Secondary | ICD-10-CM | POA: Insufficient documentation

## 2022-11-21 DIAGNOSIS — Z87891 Personal history of nicotine dependence: Secondary | ICD-10-CM | POA: Insufficient documentation

## 2022-11-21 DIAGNOSIS — Z0389 Encounter for observation for other suspected diseases and conditions ruled out: Secondary | ICD-10-CM | POA: Diagnosis not present

## 2022-11-21 DIAGNOSIS — Z9049 Acquired absence of other specified parts of digestive tract: Secondary | ICD-10-CM | POA: Diagnosis not present

## 2022-11-21 DIAGNOSIS — K810 Acute cholecystitis: Secondary | ICD-10-CM | POA: Diagnosis not present

## 2022-11-21 DIAGNOSIS — R109 Unspecified abdominal pain: Secondary | ICD-10-CM | POA: Diagnosis not present

## 2022-11-21 DIAGNOSIS — K819 Cholecystitis, unspecified: Secondary | ICD-10-CM

## 2022-11-21 DIAGNOSIS — R110 Nausea: Secondary | ICD-10-CM | POA: Diagnosis not present

## 2022-11-21 DIAGNOSIS — K8000 Calculus of gallbladder with acute cholecystitis without obstruction: Secondary | ICD-10-CM | POA: Diagnosis not present

## 2022-11-21 DIAGNOSIS — K8066 Calculus of gallbladder and bile duct with acute and chronic cholecystitis without obstruction: Secondary | ICD-10-CM | POA: Diagnosis not present

## 2022-11-21 DIAGNOSIS — R1031 Right lower quadrant pain: Secondary | ICD-10-CM | POA: Diagnosis not present

## 2022-11-21 HISTORY — PX: INTRAOPERATIVE CHOLANGIOGRAM: SHX5230

## 2022-11-21 HISTORY — PX: CHOLECYSTECTOMY: SHX55

## 2022-11-21 HISTORY — DX: Essential (primary) hypertension: I10

## 2022-11-21 HISTORY — DX: Calculus of gallbladder with acute cholecystitis without obstruction: K80.00

## 2022-11-21 LAB — COMPREHENSIVE METABOLIC PANEL
ALT: 17 U/L (ref 0–44)
AST: 17 U/L (ref 15–41)
Albumin: 3.9 g/dL (ref 3.5–5.0)
Alkaline Phosphatase: 62 U/L (ref 38–126)
Anion gap: 13 (ref 5–15)
BUN: 15 mg/dL (ref 6–20)
CO2: 21 mmol/L — ABNORMAL LOW (ref 22–32)
Calcium: 9.2 mg/dL (ref 8.9–10.3)
Chloride: 106 mmol/L (ref 98–111)
Creatinine, Ser: 0.77 mg/dL (ref 0.44–1.00)
GFR, Estimated: >60 mL/min (ref >60)
Glucose, Bld: 97 mg/dL (ref 70–99)
Potassium: 4.1 mmol/L (ref 3.5–5.1)
Sodium: 140 mmol/L (ref 135–145)
Total Bilirubin: 0.4 mg/dL (ref 0.3–1.2)
Total Protein: 7.1 g/dL (ref 6.5–8.1)

## 2022-11-21 LAB — CBC
HCT: 36.1 % (ref 36.0–46.0)
Hemoglobin: 10.7 g/dL — ABNORMAL LOW (ref 12.0–15.0)
MCH: 21.7 pg — ABNORMAL LOW (ref 26.0–34.0)
MCHC: 29.6 g/dL — ABNORMAL LOW (ref 30.0–36.0)
MCV: 73.1 fL — ABNORMAL LOW (ref 80.0–100.0)
Platelets: 506 10*3/uL — ABNORMAL HIGH (ref 150–400)
RBC: 4.94 MIL/uL (ref 3.87–5.11)
RDW: 18 % — ABNORMAL HIGH (ref 11.5–15.5)
WBC: 11.9 10*3/uL — ABNORMAL HIGH (ref 4.0–10.5)
nRBC: 0 % (ref 0.0–0.2)

## 2022-11-21 LAB — LIPASE, BLOOD: Lipase: 31 U/L (ref 11–51)

## 2022-11-21 LAB — I-STAT BETA HCG BLOOD, ED (MC, WL, AP ONLY): I-stat hCG, quantitative: <5 m[IU]/mL (ref <5)

## 2022-11-21 SURGERY — LAPAROSCOPIC CHOLECYSTECTOMY WITH INTRAOPERATIVE CHOLANGIOGRAM
Anesthesia: General | Site: Abdomen

## 2022-11-21 MED ORDER — ENOXAPARIN SODIUM 40 MG/0.4ML IJ SOSY
40.0000 mg | PREFILLED_SYRINGE | INTRAMUSCULAR | Status: DC
  Administered 2022-11-22: 40 mg via SUBCUTANEOUS
  Filled 2022-11-21: qty 0.4

## 2022-11-21 MED ORDER — IOHEXOL 350 MG/ML SOLN
75.0000 mL | Freq: Once | INTRAVENOUS | Status: AC | PRN
  Administered 2022-11-21: 75 mL via INTRAVENOUS

## 2022-11-21 MED ORDER — CHLORHEXIDINE GLUCONATE 0.12 % MT SOLN
OROMUCOSAL | Status: AC
  Filled 2022-11-21: qty 15

## 2022-11-21 MED ORDER — KETOROLAC TROMETHAMINE 30 MG/ML IJ SOLN
30.0000 mg | Freq: Four times a day (QID) | INTRAMUSCULAR | Status: DC
  Administered 2022-11-21 – 2022-11-22 (×2): 30 mg via INTRAVENOUS
  Filled 2022-11-21 (×2): qty 1

## 2022-11-21 MED ORDER — ACETAMINOPHEN 500 MG PO TABS: 1000.0000 mg | ORAL_TABLET | Freq: Three times a day (TID) | ORAL | 0 refills | Status: AC | PRN

## 2022-11-21 MED ORDER — HYDROMORPHONE HCL 1 MG/ML IJ SOLN
1.0000 mg | Freq: Once | INTRAMUSCULAR | Status: AC
  Administered 2022-11-21: 1 mg via INTRAVENOUS
  Filled 2022-11-21: qty 1

## 2022-11-21 MED ORDER — POTASSIUM CHLORIDE IN NACL 20-0.9 MEQ/L-% IV SOLN
INTRAVENOUS | Status: DC
  Filled 2022-11-21: qty 1000

## 2022-11-21 MED ORDER — LIDOCAINE 2% (20 MG/ML) 5 ML SYRINGE
INTRAMUSCULAR | Status: DC | PRN
  Administered 2022-11-21: 60 mg via INTRAVENOUS

## 2022-11-21 MED ORDER — DEXAMETHASONE SODIUM PHOSPHATE 10 MG/ML IJ SOLN
INTRAMUSCULAR | Status: DC | PRN
  Administered 2022-11-21: 10 mg via INTRAVENOUS

## 2022-11-21 MED ORDER — EPHEDRINE 5 MG/ML INJ
INTRAVENOUS | Status: AC
  Filled 2022-11-21: qty 5

## 2022-11-21 MED ORDER — ONDANSETRON HCL 4 MG/2ML IJ SOLN
INTRAMUSCULAR | Status: AC
  Filled 2022-11-21: qty 2

## 2022-11-21 MED ORDER — HYDROMORPHONE HCL 1 MG/ML IJ SOLN
1.0000 mg | INTRAMUSCULAR | Status: DC | PRN
  Administered 2022-11-21 – 2022-11-22 (×2): 1 mg via INTRAVENOUS
  Filled 2022-11-21 (×2): qty 1

## 2022-11-21 MED ORDER — FENTANYL CITRATE (PF) 250 MCG/5ML IJ SOLN
INTRAMUSCULAR | Status: DC | PRN
  Administered 2022-11-21: 50 ug via INTRAVENOUS
  Administered 2022-11-21: 100 ug via INTRAVENOUS
  Administered 2022-11-21 (×2): 50 ug via INTRAVENOUS

## 2022-11-21 MED ORDER — METHOCARBAMOL 500 MG PO TABS
500.0000 mg | ORAL_TABLET | Freq: Four times a day (QID) | ORAL | Status: DC | PRN
  Administered 2022-11-21: 500 mg via ORAL
  Filled 2022-11-21: qty 1

## 2022-11-21 MED ORDER — LIDOCAINE 2% (20 MG/ML) 5 ML SYRINGE
INTRAMUSCULAR | Status: AC
  Filled 2022-11-21: qty 5

## 2022-11-21 MED ORDER — SODIUM CHLORIDE 0.9 % IV SOLN
INTRAVENOUS | Status: DC | PRN
  Administered 2022-11-21: 14 mL

## 2022-11-21 MED ORDER — DIPHENHYDRAMINE HCL 25 MG PO CAPS: 25.0000 mg | ORAL_CAPSULE | Freq: Four times a day (QID) | ORAL | Status: DC | PRN

## 2022-11-21 MED ORDER — BUPIVACAINE HCL (PF) 0.25 % IJ SOLN
INTRAMUSCULAR | Status: AC
  Filled 2022-11-21: qty 30

## 2022-11-21 MED ORDER — ROCURONIUM BROMIDE 10 MG/ML (PF) SYRINGE
PREFILLED_SYRINGE | INTRAVENOUS | Status: DC | PRN
  Administered 2022-11-21: 60 mg via INTRAVENOUS

## 2022-11-21 MED ORDER — SODIUM CHLORIDE 0.9 % IV SOLN
2.0000 g | INTRAVENOUS | Status: AC
  Administered 2022-11-21: 2 g via INTRAVENOUS
  Filled 2022-11-21 (×2): qty 20

## 2022-11-21 MED ORDER — SUGAMMADEX SODIUM 200 MG/2ML IV SOLN
INTRAVENOUS | Status: DC | PRN
  Administered 2022-11-21: 200 mg via INTRAVENOUS

## 2022-11-21 MED ORDER — PROPOFOL 10 MG/ML IV BOLUS
INTRAVENOUS | Status: AC
  Filled 2022-11-21: qty 20

## 2022-11-21 MED ORDER — FENTANYL CITRATE (PF) 250 MCG/5ML IJ SOLN
INTRAMUSCULAR | Status: AC
  Filled 2022-11-21: qty 5

## 2022-11-21 MED ORDER — ACETAMINOPHEN 500 MG PO TABS
1000.0000 mg | ORAL_TABLET | Freq: Four times a day (QID) | ORAL | Status: DC
  Administered 2022-11-22 (×3): 1000 mg via ORAL
  Filled 2022-11-21 (×3): qty 2

## 2022-11-21 MED ORDER — ACETAMINOPHEN 10 MG/ML IV SOLN
INTRAVENOUS | Status: AC
  Filled 2022-11-21: qty 100

## 2022-11-21 MED ORDER — ONDANSETRON HCL 4 MG/2ML IJ SOLN
INTRAMUSCULAR | Status: DC | PRN
  Administered 2022-11-21: 4 mg via INTRAVENOUS

## 2022-11-21 MED ORDER — CIPROFLOXACIN IN D5W 400 MG/200ML IV SOLN: 400.0000 mg | INTRAVENOUS | Status: DC

## 2022-11-21 MED ORDER — CHLORHEXIDINE GLUCONATE CLOTH 2 % EX PADS: 6.0000 | MEDICATED_PAD | Freq: Once | CUTANEOUS | Status: DC

## 2022-11-21 MED ORDER — METHOCARBAMOL 1000 MG/10ML IJ SOLN
500.0000 mg | Freq: Once | INTRAVENOUS | Status: AC
  Administered 2022-11-21: 500 mg via INTRAVENOUS
  Filled 2022-11-21: qty 500

## 2022-11-21 MED ORDER — KETOROLAC TROMETHAMINE 30 MG/ML IJ SOLN
INTRAMUSCULAR | Status: DC | PRN
  Administered 2022-11-21: 30 mg via INTRAVENOUS

## 2022-11-21 MED ORDER — BUPIVACAINE-EPINEPHRINE 0.25% -1:200000 IJ SOLN
INTRAMUSCULAR | Status: DC | PRN
  Administered 2022-11-21: 10 mL

## 2022-11-21 MED ORDER — ROCURONIUM BROMIDE 10 MG/ML (PF) SYRINGE
PREFILLED_SYRINGE | INTRAVENOUS | Status: AC
  Filled 2022-11-21: qty 10

## 2022-11-21 MED ORDER — KETOROLAC TROMETHAMINE 30 MG/ML IJ SOLN
INTRAMUSCULAR | Status: AC
  Filled 2022-11-21: qty 1

## 2022-11-21 MED ORDER — SODIUM CHLORIDE 0.9 % IR SOLN
Status: DC | PRN
  Administered 2022-11-21: 1000 mL

## 2022-11-21 MED ORDER — PANTOPRAZOLE SODIUM 40 MG PO TBEC
40.0000 mg | DELAYED_RELEASE_TABLET | Freq: Every day | ORAL | Status: DC
  Administered 2022-11-21 – 2022-11-22 (×2): 40 mg via ORAL
  Filled 2022-11-21 (×2): qty 1

## 2022-11-21 MED ORDER — HYDROMORPHONE HCL 1 MG/ML IJ SOLN
0.5000 mg | INTRAMUSCULAR | Status: AC | PRN
  Administered 2022-11-21 (×2): 0.5 mg via INTRAVENOUS

## 2022-11-21 MED ORDER — SODIUM CHLORIDE 0.9 % IV BOLUS
1000.0000 mL | Freq: Once | INTRAVENOUS | Status: AC
  Administered 2022-11-21: 1000 mL via INTRAVENOUS

## 2022-11-21 MED ORDER — ONDANSETRON 4 MG PO TBDP: 4.0000 mg | ORAL_TABLET | Freq: Four times a day (QID) | ORAL | Status: DC | PRN

## 2022-11-21 MED ORDER — DIPHENHYDRAMINE HCL 50 MG/ML IJ SOLN: 25.0000 mg | Freq: Four times a day (QID) | INTRAMUSCULAR | Status: DC | PRN

## 2022-11-21 MED ORDER — DEXAMETHASONE SODIUM PHOSPHATE 10 MG/ML IJ SOLN
INTRAMUSCULAR | Status: AC
  Filled 2022-11-21: qty 1

## 2022-11-21 MED ORDER — HYDROMORPHONE HCL 1 MG/ML IJ SOLN
INTRAMUSCULAR | Status: AC
  Filled 2022-11-21: qty 1

## 2022-11-21 MED ORDER — DEXAMETHASONE SODIUM PHOSPHATE 10 MG/ML IJ SOLN
INTRAMUSCULAR | Status: AC
  Filled 2022-11-21: qty 2

## 2022-11-21 MED ORDER — SODIUM CHLORIDE 0.9 % IV SOLN
2.0000 g | INTRAVENOUS | Status: DC
  Administered 2022-11-22: 2 g via INTRAVENOUS
  Filled 2022-11-21: qty 20

## 2022-11-21 MED ORDER — 0.9 % SODIUM CHLORIDE (POUR BTL) OPTIME
TOPICAL | Status: DC | PRN
  Administered 2022-11-21: 1000 mL

## 2022-11-21 MED ORDER — LACTATED RINGERS IV SOLN: INTRAVENOUS | Status: DC | PRN

## 2022-11-21 MED ORDER — MIDAZOLAM HCL 2 MG/2ML IJ SOLN
INTRAMUSCULAR | Status: DC | PRN
  Administered 2022-11-21: 2 mg via INTRAVENOUS

## 2022-11-21 MED ORDER — HYDROMORPHONE HCL 1 MG/ML IJ SOLN
0.2500 mg | INTRAMUSCULAR | Status: DC | PRN
  Administered 2022-11-21 (×4): 0.5 mg via INTRAVENOUS

## 2022-11-21 MED ORDER — ACETAMINOPHEN 10 MG/ML IV SOLN
INTRAVENOUS | Status: DC | PRN
  Administered 2022-11-21: 1000 mg via INTRAVENOUS

## 2022-11-21 MED ORDER — OXYCODONE HCL 5 MG PO TABS
5.0000 mg | ORAL_TABLET | ORAL | Status: DC | PRN
  Administered 2022-11-21 – 2022-11-22 (×5): 10 mg via ORAL
  Filled 2022-11-21 (×5): qty 2

## 2022-11-21 MED ORDER — OXYCODONE HCL 5 MG PO TABS
5.0000 mg | ORAL_TABLET | Freq: Four times a day (QID) | ORAL | 0 refills | Status: DC | PRN
  Filled 2022-11-21: qty 15, 4d supply, fill #0

## 2022-11-21 MED ORDER — IBUPROFEN 800 MG PO TABS: 800.0000 mg | ORAL_TABLET | Freq: Three times a day (TID) | ORAL | 0 refills | Status: AC | PRN

## 2022-11-21 MED ORDER — PROPOFOL 10 MG/ML IV BOLUS
INTRAVENOUS | Status: DC | PRN
  Administered 2022-11-21: 150 mg via INTRAVENOUS
  Administered 2022-11-21: 50 mg via INTRAVENOUS

## 2022-11-21 MED ORDER — MIDAZOLAM HCL 2 MG/2ML IJ SOLN
INTRAMUSCULAR | Status: AC
  Filled 2022-11-21: qty 2

## 2022-11-21 MED ORDER — ONDANSETRON HCL 4 MG/2ML IJ SOLN: 4.0000 mg | Freq: Four times a day (QID) | INTRAMUSCULAR | Status: DC | PRN

## 2022-11-21 SURGICAL SUPPLY — 46 items
APPLIER CLIP ROT 10 11.4 M/L (STAPLE) ×1
BAG COUNTER SPONGE SURGICOUNT (BAG) ×1 IMPLANT
BENZOIN TINCTURE PRP APPL 2/3 (GAUZE/BANDAGES/DRESSINGS) ×1 IMPLANT
BLADE CLIPPER SURG (BLADE) IMPLANT
CANISTER SUCT 3000ML PPV (MISCELLANEOUS) ×1 IMPLANT
CHLORAPREP W/TINT 26 (MISCELLANEOUS) ×1 IMPLANT
CLIP APPLIE ROT 10 11.4 M/L (STAPLE) ×1 IMPLANT
CNTNR URN SCR LID CUP LEK RST (MISCELLANEOUS) IMPLANT
CONT SPEC 4OZ STRL OR WHT (MISCELLANEOUS) ×1
COVER MAYO STAND STRL (DRAPES) ×1 IMPLANT
COVER SURGICAL LIGHT HANDLE (MISCELLANEOUS) ×1 IMPLANT
DRAPE C-ARM 42X120 X-RAY (DRAPES) ×1 IMPLANT
DRSG TEGADERM 2-3/8X2-3/4 SM (GAUZE/BANDAGES/DRESSINGS) ×3 IMPLANT
DRSG TEGADERM 4X4.75 (GAUZE/BANDAGES/DRESSINGS) ×1 IMPLANT
ELECT REM PT RETURN 9FT ADLT (ELECTROSURGICAL) ×1
ELECTRODE REM PT RTRN 9FT ADLT (ELECTROSURGICAL) ×1 IMPLANT
GAUZE SPONGE 2X2 8PLY STRL LF (GAUZE/BANDAGES/DRESSINGS) ×1 IMPLANT
GLOVE BIO SURGEON STRL SZ7 (GLOVE) ×1 IMPLANT
GLOVE BIOGEL PI IND STRL 7.5 (GLOVE) ×1 IMPLANT
GOWN STRL REUS W/ TWL LRG LVL3 (GOWN DISPOSABLE) ×3 IMPLANT
GOWN STRL REUS W/TWL LRG LVL3 (GOWN DISPOSABLE) ×3
IRRIG SUCT STRYKERFLOW 2 WTIP (MISCELLANEOUS) ×1
IRRIGATION SUCT STRKRFLW 2 WTP (MISCELLANEOUS) ×1 IMPLANT
KIT BASIN OR (CUSTOM PROCEDURE TRAY) ×1 IMPLANT
KIT IMAGING PINPOINTPAQ (MISCELLANEOUS) IMPLANT
KIT TURNOVER KIT B (KITS) ×1 IMPLANT
NS IRRIG 1000ML POUR BTL (IV SOLUTION) ×1 IMPLANT
PAD ARMBOARD 7.5X6 YLW CONV (MISCELLANEOUS) ×1 IMPLANT
POUCH RETRIEVAL ECOSAC 10 (ENDOMECHANICALS) IMPLANT
SCISSORS LAP 5X35 DISP (ENDOMECHANICALS) ×1 IMPLANT
SET CHOLANGIOGRAPH 5 50 .035 (SET/KITS/TRAYS/PACK) ×1 IMPLANT
SET TUBE SMOKE EVAC HIGH FLOW (TUBING) ×1 IMPLANT
SLEEVE Z-THREAD 5X100MM (TROCAR) ×1 IMPLANT
SPECIMEN JAR SMALL (MISCELLANEOUS) ×1 IMPLANT
STRIP CLOSURE SKIN 1/2X4 (GAUZE/BANDAGES/DRESSINGS) ×1 IMPLANT
SUT MNCRL AB 4-0 PS2 18 (SUTURE) ×1 IMPLANT
SYS BAG RETRIEVAL 10MM (BASKET)
SYSTEM BAG RETRIEVAL 10MM (BASKET) IMPLANT
TOWEL GREEN STERILE (TOWEL DISPOSABLE) ×1 IMPLANT
TOWEL GREEN STERILE FF (TOWEL DISPOSABLE) ×1 IMPLANT
TRAY LAPAROSCOPIC MC (CUSTOM PROCEDURE TRAY) ×1 IMPLANT
TROCAR 11X100 Z THREAD (TROCAR) ×1 IMPLANT
TROCAR BALLN 12MMX100 BLUNT (TROCAR) ×1 IMPLANT
TROCAR Z-THREAD OPTICAL 5X100M (TROCAR) ×1 IMPLANT
WARMER LAPAROSCOPE (MISCELLANEOUS) ×1 IMPLANT
WATER STERILE IRR 1000ML POUR (IV SOLUTION) ×1 IMPLANT

## 2022-11-21 NOTE — ED Provider Notes (Signed)
[facility] [address]  SURGICAL Provider Note  CSN: [id number] Arrival date & time: 11/21/22 1010  Chief Complaint(s) Abdominal Pain  HPI Erika Foley is a 42 y.o. female with past medical history as below, significant for migraines, hypertension, former smoker, chronic back pain who presents to the ED with complaint of right lower quad abdominal pain, nausea and vomiting.  Seen at urgent care this morning, given antiemetic and sent to the ED for evaluation.  Onset over the last 24 hours, pain to right upper quadrant and right flank, nausea.  Unable to tolerate p.o. today.  No fevers.  No abdominal trauma.  No change to bowel or bladder function.  No recent diet or medication changes.  She has history of kidney stones but does not feel as though this pain is consistent with her prior kidney stone discomfort.  No history of gallstones.  No BRBPR or melena, no daily alcohol use or daily NSAID use.  Denies prior abdominal surgery  Nausea improved following Zofran from urgent care  Past Medical History Past Medical History:  Diagnosis Date   Hypertension    Patient Active Problem List   Diagnosis Date Noted   Acute cholecystitis due to biliary calculus 11/21/2022   Home Medication(s) Prior to Admission medications   Medication Sig Start Date End Date Taking? Authorizing Provider  acetaminophen (TYLENOL) 500 MG tablet Take 2 tablets (1,000 mg total) by mouth every 8 (eight) hours as needed. 11/21/22  Yes Maczis, Elmer Sow, PA-C  esomeprazole (NEXIUM) 40 MG capsule Take 40 mg by mouth daily at 12 noon.   Yes [provider]  oxyCODONE (ROXICODONE) 5 MG immediate release tablet Take 1 tablet (5 mg total) by mouth every 6 (six) hours as needed for breakthrough pain. 11/21/22  Yes Maczis, Elmer Sow, PA-C  ibuprofen (ADVIL) 800 MG tablet Take 1 tablet (800 mg total) by mouth every 8 (eight) hours as needed for up to 5 days. 11/21/22 11/26/22  Maczis, Elmer Sow, PA-C   methocarbamol (ROBAXIN) 500 MG tablet Take 1 tablet (500 mg total) by mouth every 6 (six) hours as needed for up to 3 days for muscle spasms (pain). 11/22/22 11/25/22  Eric Form, PA-C                                                                                                                                    Past Surgical History Past Surgical History:  Procedure Laterality Date   CHOLECYSTECTOMY N/A 11/21/2022   Procedure: LAPAROSCOPIC CHOLECYSTECTOMY;  Surgeon: Manus Rudd, MD;  Location: [facility] OR;  Service: General;  Laterality: N/A;   INTRAOPERATIVE CHOLANGIOGRAM N/A 11/21/2022   Procedure: INTRAOPERATIVE CHOLANGIOGRAM;  Surgeon: Manus Rudd, MD;  Location: [facility] OR;  Service: General;  Laterality: N/A;   Family History History reviewed. No pertinent family history.  Social History Social History   Tobacco Use   Smoking status: Former   Smokeless  tobacco: Never  Substance Use Topics   Alcohol use: No   Drug use: No   Allergies Amoxicillin  Review of Systems Review of Systems  Constitutional:  Negative for chills and fever.  HENT:  Negative for facial swelling and trouble swallowing.   Eyes:  Negative for photophobia and visual disturbance.  Respiratory:  Negative for cough and shortness of breath.   Cardiovascular:  Negative for chest pain and palpitations.  Gastrointestinal:  Positive for abdominal pain and nausea. Negative for vomiting.  Endocrine: Negative for polydipsia and polyuria.  Genitourinary:  Negative for difficulty urinating and hematuria.  Musculoskeletal:  Positive for back pain. Negative for gait problem and joint swelling.  Skin:  Negative for pallor and rash.  Neurological:  Negative for syncope and headaches.  Psychiatric/Behavioral:  Negative for agitation and confusion.     Physical Exam Vital Signs  I have reviewed the triage vital signs BP 135/69 (BP Location: Right Arm)   Pulse 65   Temp 98.2 F (36.8 C) (Oral)   Resp 16   Ht 5'  1" (1.549 m)   Wt 98 kg   LMP 11/14/2022 (Exact Date)   SpO2 96%   BMI 40.82 kg/m  Physical Exam Vitals and nursing note reviewed.  Constitutional:      General: She is not in acute distress.    Appearance: Normal appearance. She is well-developed. She is obese.  HENT:     Head: Normocephalic and atraumatic.     Right Ear: External ear normal.     Left Ear: External ear normal.     Nose: Nose normal.     Mouth/Throat:     Mouth: Mucous membranes are moist.  Eyes:     General: No scleral icterus.       Right eye: No discharge.        Left eye: No discharge.  Cardiovascular:     Rate and Rhythm: Normal rate and regular rhythm.     Pulses: Normal pulses.     Heart sounds: Normal heart sounds.  Pulmonary:     Effort: Pulmonary effort is normal. No respiratory distress.     Breath sounds: Normal breath sounds.  Abdominal:     General: Abdomen is flat.     Palpations: Abdomen is soft.     Tenderness: There is abdominal tenderness. Positive signs include Murphy's sign.       Comments: Not peritoneal   Musculoskeletal:        General: Normal range of motion.     Right lower leg: No edema.     Left lower leg: No edema.  Skin:    General: Skin is warm and dry.     Capillary Refill: Capillary refill takes less than 2 seconds.  Neurological:     Mental Status: She is alert.  Psychiatric:        Mood and Affect: Mood normal.        Behavior: Behavior normal.     ED Results and Treatments Labs (all labs ordered are listed, but only abnormal results are displayed) Labs Reviewed  COMPREHENSIVE METABOLIC PANEL - Abnormal; Notable for the following components:      Result Value   CO2 21 (*)    All other components within normal limits  CBC - Abnormal; Notable for the following components:   WBC 11.9 (*)    Hemoglobin 10.7 (*)    MCV 73.1 (*)    MCH 21.7 (*)    MCHC 29.6 (*)  RDW 18.0 (*)    Platelets 506 (*)    All other components within normal limits  CBC -  Abnormal; Notable for the following components:   WBC 17.6 (*)    Hemoglobin 9.6 (*)    HCT 31.8 (*)    MCV 71.1 (*)    MCH 21.5 (*)    RDW 17.7 (*)    Platelets 476 (*)    All other components within normal limits  COMPREHENSIVE METABOLIC PANEL - Abnormal; Notable for the following components:   CO2 21 (*)    Glucose, Bld 115 (*)    Calcium 8.5 (*)    Total Protein 6.3 (*)    Albumin 3.4 (*)    AST 43 (*)    All other components within normal limits  LIPASE, BLOOD  I-STAT BETA HCG BLOOD, ED ([facility], [facility], [facility] ONLY)  SURGICAL PATHOLOGY                                                                                                                          Radiology No results found.  Pertinent labs & imaging results that were available during my care of the patient were reviewed by me and considered in my medical decision making (see MDM for details).  Medications Ordered in ED Medications  chlorhexidine (PERIDEX) 0.12 % solution (  Canceled Entry 11/21/22 1956)  chlorhexidine (PERIDEX) 0.12 % solution (  Canceled Entry 11/21/22 1956)  HYDROmorphone (DILAUDID) 1 MG/ML injection (  Canceled Entry 11/21/22 2025)  HYDROmorphone (DILAUDID) 1 MG/ML injection (  Canceled Entry 11/21/22 2026)  HYDROmorphone (DILAUDID) 1 MG/ML injection (  Canceled Entry 11/21/22 2026)  HYDROmorphone (DILAUDID) injection 1 mg (1 mg Intravenous Given 11/21/22 1055)  sodium chloride 0.9 % bolus 1,000 mL (0 mLs Intravenous Stopped 11/21/22 1350)  iohexol (OMNIPAQUE) 350 MG/ML injection 75 mL (75 mLs Intravenous Contrast Given 11/21/22 1248)  cefTRIAXone (ROCEPHIN) 2 g in sodium chloride 0.9 % 100 mL IVPB ( Intravenous Canceled Entry 11/22/22 0600)  HYDROmorphone (DILAUDID) injection 0.5 mg (0.5 mg Intravenous Given 11/21/22 1658)  methocarbamol (ROBAXIN) 500 mg in dextrose 5 % 50 mL IVPB (0 mg Intravenous Stopped 11/21/22 1750)                                                                                                                                      Procedures Procedures  (including critical  care time)  Medical Decision Making / ED Course    Medical Decision Making:    HARMONEY MCLELLAN is a 42 y.o. female with past medical history as below, significant for migraines, hypertension, former smoker, chronic back pain who presents to the ED with complaint of right lower quad abdominal pain, nausea and vomiting.  . The complaint involves an extensive differential diagnosis and also carries with it a high risk of complications and morbidity.  Serious etiology was considered. Ddx includes but is not limited to: Differential diagnosis includes but is not exclusive to ectopic pregnancy, ovarian cyst, ovarian torsion, acute appendicitis, urinary tract infection, endometriosis, bowel obstruction, hernia, colitis, renal colic, gastroenteritis, volvulus etc.   Complete initial physical exam performed, notably the patient  was ruq ttp, +murphy, no jaundice.    Reviewed and confirmed nursing documentation for past medical history, family history, social history.  Vital signs reviewed.    Clinical Course as of 11/23/22 1945  Thu Nov 21, 2022  1328 Gen surg paged  [SG]  1336 D/w Dr Corliss Skains, will come see in consult  [SG]    Clinical Course User Index [SG] Sloan Leiter, DO   Exam concerning for biliary colic +murphy sign CT concerning for cholecystitis but no gallstone noted LFTs and alk phos are normal, bili is not elevated.  She is not jaundiced.  Afebrile.  Patient with modest improvement to her discomfort following analgesics, will discuss with general surgery given concern for cholecystitis  Gen surg to eval, spoke with attending as above and PA at bedside, plan for OR today. Pt has not had PO since last night, pain improved on recheck. Admit to [facility]       Additional history obtained: -Additional history obtained from family -External records from outside source obtained and reviewed including: Chart  review including previous notes, labs, imaging, consultation notes including UC documentation, home meds, primary care documentation    Lab Tests: -I ordered, reviewed, and interpreted labs.   The pertinent results include:   Labs Reviewed  COMPREHENSIVE METABOLIC PANEL - Abnormal; Notable for the following components:      Result Value   CO2 21 (*)    All other components within normal limits  CBC - Abnormal; Notable for the following components:   WBC 11.9 (*)    Hemoglobin 10.7 (*)    MCV 73.1 (*)    MCH 21.7 (*)    MCHC 29.6 (*)    RDW 18.0 (*)    Platelets 506 (*)    All other components within normal limits  CBC - Abnormal; Notable for the following components:   WBC 17.6 (*)    Hemoglobin 9.6 (*)    HCT 31.8 (*)    MCV 71.1 (*)    MCH 21.5 (*)    RDW 17.7 (*)    Platelets 476 (*)    All other components within normal limits  COMPREHENSIVE METABOLIC PANEL - Abnormal; Notable for the following components:   CO2 21 (*)    Glucose, Bld 115 (*)    Calcium 8.5 (*)    Total Protein 6.3 (*)    Albumin 3.4 (*)    AST 43 (*)    All other components within normal limits  LIPASE, BLOOD  I-STAT BETA HCG BLOOD, ED ([facility], [facility], [facility] ONLY)  SURGICAL PATHOLOGY    Notable for slight leukocytosis  EKG   EKG Interpretation  Date/Time:  Thursday Nov 21 2022 09:59:12 EDT Ventricular Rate:  80 PR Interval:  138 QRS Duration: 72 QT Interval:  372 QTC Calculation: 429 R Axis:   54 Text Interpretation: Normal sinus rhythm with sinus arrhythmia Normal ECG No previous ECGs available no stemi No old tracing to compare Confirmed by Tanda Rockers ([id number]) on 11/21/2022 10:43:57 AM         Imaging Studies ordered: I ordered imaging studies including ctap I independently visualized the following imaging with scope of interpretation limited to determining acute life threatening conditions related to emergency care; findings noted above, significant for cholecystitis I independently  visualized and interpreted imaging. I agree with the radiologist interpretation   Medicines ordered and prescription drug management: Meds ordered this encounter  Medications   HYDROmorphone (DILAUDID) injection 1 mg   sodium chloride 0.9 % bolus 1,000 mL   iohexol (OMNIPAQUE) 350 MG/ML injection 75 mL   DISCONTD: HYDROmorphone (DILAUDID) injection 0.25-0.5 mg   chlorhexidine (PERIDEX) 0.12 % solution    Matilde Haymaker, Marissa M: cabinet override   chlorhexidine (PERIDEX) 0.12 % solution    Harvell, Carson L: cabinet override   DISCONTD: Chlorhexidine Gluconate Cloth 2 % PADS 6 each   DISCONTD: Chlorhexidine Gluconate Cloth 2 % PADS 6 each   DISCONTD: ciprofloxacin (CIPRO) IVPB 400 mg    Order Specific Question:   Indication:    Answer:   Surgical Prophylaxis   cefTRIAXone (ROCEPHIN) 2 g in sodium chloride 0.9 % 100 mL IVPB    Order Specific Question:   Antibiotic Indication:    Answer:   Intra-abdominal   DISCONTD: 0.9 % irrigation (POUR BTL)   DISCONTD: sodium chloride irrigation 0.9 %   DISCONTD: bupivacaine-EPINEPHrine (MARCAINE W/ EPI) 0.25% -1:200000 (with pres) injection   DISCONTD: iopamidol (ISOVUE-300) 61% in 0.9% normal saline Optime   ibuprofen (ADVIL) 800 MG tablet    Sig: Take 1 tablet (800 mg total) by mouth every 8 (eight) hours as needed for up to 5 days.    Dispense:  15 tablet    Refill:  0   acetaminophen (TYLENOL) 500 MG tablet    Sig: Take 2 tablets (1,000 mg total) by mouth every 8 (eight) hours as needed.    Dispense:  30 tablet    Refill:  0   oxyCODONE (ROXICODONE) 5 MG immediate release tablet    Sig: Take 1 tablet (5 mg total) by mouth every 6 (six) hours as needed for breakthrough pain.    Dispense:  15 tablet    Refill:  0    Order Specific Question:   Supervising Provider    Answer:   TSUEI, MATTHEW [[id number]]   HYDROmorphone (DILAUDID) 1 MG/ML injection    Gorrita, Emanuela K: cabinet override   HYDROmorphone (DILAUDID) 1 MG/ML injection    Goin,  Alana E: cabinet override   HYDROmorphone (DILAUDID) injection 0.5 mg   HYDROmorphone (DILAUDID) 1 MG/ML injection    Goin, Alana E: cabinet override   methocarbamol (ROBAXIN) 500 mg in dextrose 5 % 50 mL IVPB   DISCONTD: enoxaparin (LOVENOX) injection 40 mg   DISCONTD: 0.9 % NaCl with KCl 20 mEq/ L  infusion   DISCONTD: cefTRIAXone (ROCEPHIN) 2 g in sodium chloride 0.9 % 100 mL IVPB    Order Specific Question:   Antibiotic Indication:    Answer:   Intra-abdominal   DISCONTD: acetaminophen (TYLENOL) tablet 1,000 mg   DISCONTD: ketorolac (TORADOL) 30 MG/ML injection 30 mg   DISCONTD: oxyCODONE (Oxy IR/ROXICODONE) immediate release tablet 5-10 mg   DISCONTD: HYDROmorphone (DILAUDID) injection 1 mg   DISCONTD: methocarbamol (  ROBAXIN) tablet 500 mg   DISCONTD: diphenhydrAMINE (BENADRYL) capsule 25 mg   DISCONTD: diphenhydrAMINE (BENADRYL) injection 25 mg   DISCONTD: ondansetron (ZOFRAN-ODT) disintegrating tablet 4 mg   DISCONTD: ondansetron (ZOFRAN) injection 4 mg   DISCONTD: pantoprazole (PROTONIX) EC tablet 40 mg   DISCONTD: methocarbamol (ROBAXIN) tablet 500 mg   methocarbamol (ROBAXIN) 500 MG tablet    Sig: Take 1 tablet (500 mg total) by mouth every 6 (six) hours as needed for up to 3 days for muscle spasms (pain).    Dispense:  12 tablet    Refill:  0    -I have reviewed the patients home medicines and have made adjustments as needed   Consultations Obtained: I requested consultation with the gen surg,  and discussed lab and imaging findings as well as pertinent plan - they recommend: will come eval, OR   Cardiac Monitoring: The patient was maintained on a cardiac monitor.  I personally viewed and interpreted the cardiac monitored which showed an underlying rhythm of: NSR  Social Determinants of Health:  Diagnosis or treatment significantly limited by social determinants of health: former smoker   Reevaluation: After the interventions noted above, I reevaluated the patient  and found that they have improved  Co morbidities that complicate the patient evaluation  Past Medical History:  Diagnosis Date   Hypertension       Dispostion: Disposition decision including need for hospitalization was considered, and patient admitted to the hospital.    Final Clinical Impression(s) / ED Diagnoses Final diagnoses:  RUQ abdominal pain  Cholecystitis     This chart was dictated using voice recognition software.  Despite best efforts to proofread,  errors can occur which can change the documentation meaning.    Sloan Leiter, DO 11/23/22 1945

## 2022-11-21 NOTE — Consult Note (Signed)
Erika Foley 02/08/1981  [id number].    Requesting MD: Dr. Tanda Rockers Chief Complaint/Reason for Consult: Acute Cholecystitis  HPI: Erika Foley is a 42 y.o. female who presented to the ED for right sided abdominal pain radiating into her back. Patient reports that this morning she developed right sided abdominal pain with associated nausea and emesis while at work. She went to urgent care who stated she may have a UTI. She is menstruating and has had some abdominal cramping from this. No fever or urinary symptoms. No hx of similar episodes.  In the ED she was afebrile without tachycardia or hypotension. WBC 11.9. Lipase and LFT's wnl. CT w/ no radiopaque calculi identified but distended GB w/ wall thickening vs pericholecystitic fluid. Appendix is normal in appearance. We were asked to see.   Prior Abdominal Surgeries: None Blood Thinners: None Last PO intake: dinner last night and sips of soda early this am Tobacco Use: Former smoker Alcohol Use: None Substance use: None   Employment: [employer] note   ROS: ROS As above, see hpi  History reviewed. No pertinent family history.  History reviewed. No pertinent past medical history.  History reviewed. No pertinent surgical history.  Social History:  reports that she has quit smoking. She has never used smokeless tobacco. She reports that she does not drink alcohol and does not use drugs.  Allergies:  Allergies  Allergen Reactions   Amoxicillin Hives    (Not in a hospital admission)    Physical Exam: Blood pressure (!) 171/96, pulse 68, temperature 98.3 F (36.8 C), temperature source Oral, resp. rate 17, height 5\' 1"  (1.549 m), weight 97.5 kg, last menstrual period 11/14/2022, SpO2 98 %. General: pleasant, WD, female who is laying in bed in NAD HEENT: head is normocephalic, atraumatic.  Sclera are noninjected.  Pupils equal and round. EOMs intact.  Ears and nose without any masses or lesions.  Mouth is pink and  moist Heart: regular, rate, and rhythm.  Normal s1,s2. No obvious murmurs, gallops, or rubs noted.  Palpable radial pulse Lungs: CTAB, no wheezes, rhonchi, or rales noted.  Respiratory effort nonlabored Abd: soft, ND, +BS, no masses, hernias, or organomegaly. Significant TTP RUQ with voluntary guarding MSK: all 4 extremities are symmetrical with no cyanosis, clubbing, or edema. Skin: warm and dry with no masses, lesions, or rashes Neuro: Cranial nerves 2-12 grossly intact, sensation is normal throughout Psych: A&Ox3 with an appropriate affect.    Results for orders placed or performed during the hospital encounter of 11/21/22 (from the past 48 hour(s))  Lipase, blood     Status: None   Collection Time: 11/21/22 10:19 AM  Result Value Ref Range   Lipase 31 11 - 51 U/L    Comment: Performed at [facility], [address], [place], [place] [address]  Comprehensive metabolic panel     Status: Abnormal   Collection Time: 11/21/22 10:19 AM  Result Value Ref Range   Sodium 140 135 - 145 mmol/L   Potassium 4.1 3.5 - 5.1 mmol/L   Chloride 106 98 - 111 mmol/L   CO2 21 (L) 22 - 32 mmol/L   Glucose, Bld 97 70 - 99 mg/dL    Comment: Glucose reference range applies only to samples taken after fasting for at least 8 hours.   BUN 15 6 - 20 mg/dL   Creatinine, Ser 1.91 0.44 - 1.00 mg/dL   Calcium 9.2 8.9 - 47.8 mg/dL   Total Protein 7.1 6.5 -  8.1 g/dL   Albumin 3.9 3.5 - 5.0 g/dL   AST 17 15 - 41 U/L   ALT 17 0 - 44 U/L   Alkaline Phosphatase 62 38 - 126 U/L   Total Bilirubin 0.4 0.3 - 1.2 mg/dL   GFR, Estimated >16 >10 mL/min    Comment: (NOTE) Calculated using the CKD-EPI Creatinine Equation (2021)    Anion gap 13 5 - 15    Comment: Performed at [facility], [address] [place], [place] [address]  CBC     Status: Abnormal   Collection Time: 11/21/22 10:19 AM  Result Value Ref Range   WBC 11.9 (H) 4.0 - 10.5 K/uL   RBC 4.94 3.87 - 5.11 MIL/uL   Hemoglobin 10.7 (L) 12.0  - 15.0 g/dL   HCT 40.9 81.1 - 91.4 %   MCV 73.1 (L) 80.0 - 100.0 fL   MCH 21.7 (L) 26.0 - 34.0 pg   MCHC 29.6 (L) 30.0 - 36.0 g/dL   RDW 78.2 (H) 95.6 - 21.3 %   Platelets 506 (H) 150 - 400 K/uL   nRBC 0.0 0.0 - 0.2 %    Comment: Performed at [facility], [address] [place], [place] [address]  I-Stat beta hCG blood, ED     Status: None   Collection Time: 11/21/22 10:31 AM  Result Value Ref Range   I-stat hCG, quantitative <5.0 <5 mIU/mL   Comment 3            Comment:   GEST. AGE      CONC.  (mIU/mL)   <=1 WEEK        5 - 50     2 WEEKS       50 - 500     3 WEEKS       100 - 10,000     4 WEEKS     1,000 - 30,000        FEMALE AND NON-PREGNANT FEMALE:     LESS THAN 5 mIU/mL    CT ABDOMEN PELVIS W CONTRAST  Result Date: 11/21/2022 CLINICAL DATA:  Acute abdominal pain. Epigastric, RIGHT UPPER QUADRANT, and RIGHT flank pain of sudden on set. EXAM: CT ABDOMEN AND PELVIS WITH CONTRAST TECHNIQUE: Multidetector CT imaging of the abdomen and pelvis was performed using the standard protocol following bolus administration of intravenous contrast. RADIATION DOSE REDUCTION: This exam was performed according to the departmental dose-optimization program which includes automated exposure control, adjustment of the mA and/or kV according to patient size and/or use of iterative reconstruction technique. CONTRAST:  75mL OMNIPAQUE IOHEXOL 350 MG/ML SOLN COMPARISON:  None Available. FINDINGS: Lower chest: No acute abnormality. Hepatobiliary: Liver is homogeneous. The gallbladder is distended and has a thickened wall measuring 1 centimeter. Alternatively, this could represent pericholecystic fluid. No radiopaque calculi are identified. No intra or extrahepatic biliary duct dilatation. Pancreas: Unremarkable. No pancreatic ductal dilatation or surrounding inflammatory changes. Spleen: Normal in size without focal abnormality. Adrenals/Urinary Tract: Adrenal glands are normal. Kidneys are symmetric in  size and enhancement. No hydronephrosis or suspicious renal mass. Ureters and urinary bladder are unremarkable. Stomach/Bowel: Small hiatal hernia. Stomach is otherwise normal in appearance. Small bowel loops are normal in caliber and wall thickness. The There are numerous colonic diverticula, not associated with acute inflammatory changes. Appendix is normal in appearance. The terminal ileum is distended by stool but otherwise normal in appearance. Vascular/Lymphatic: No significant vascular findings are present. No enlarged abdominal or pelvic lymph nodes.  Reproductive: The uterus is present. Normal appearance of both ovaries. Other: Small fat containing paraumbilical hernia.  No ascites. Musculoskeletal: 2 there are mild degenerative changes in the LOWER thoracic and lumbar spine. Moderate disc height loss at L5-S1. IMPRESSION: 1. Gallbladder wall thickening versus pericholecystic fluid. Findings are suspicious for acute cholecystitis. Consider further evaluation with ultrasound. 2. Small hiatal hernia. 3. Colonic diverticulosis without evidence for acute diverticulitis. 4. Small fat containing paraumbilical hernia. 5. Moderate degenerative disc disease at L5-S1. Electronically Signed   By: Norva Pavlov M.D.   On: 11/21/2022 13:09   DG Thoracic Spine 2 View  Result Date: 11/21/2022 CLINICAL DATA:  Back EXAM: THORACIC SPINE 2 VIEWS COMPARISON:  None Available. FINDINGS: There is no evidence of thoracic spine fracture. No listhesis. No other significant bone abnormalities are identified. IMPRESSION: Negative. Electronically Signed   By: Wiliam Ke M.D.   On: 11/21/2022 11:35    Anti-infectives (From admission, onward)    None       Assessment/Plan Acute Cholecystitis  Patient seen and examined. Presentation, labs, imaging c/w Acute Cholecystitis. Recommend IV abx and Laparoscopic Cholecystectomy. I have explained the procedure, risks, and aftercare of Laparoscopic cholecystectomy.  Risks  include but are not limited to anesthesia, bleeding, infection, wound problems, bile leak, injury to common bile duct/liver/intestine, possible need for open cholecystectomy, and diarrhea post op. She seems to understand and agrees to proceed. Keep NPO. Start IV abx. Possible d/c from PACU. If requires admission postoperatively, will plan admission to observation.   FEN - NPO, IVF VTE - SCDs ID - Rocephin  I reviewed nursing notes, ED provider notes, last 24 h vitals and pain scores, last 48 h intake and output, last 24 h labs and trends, and last 24 h imaging results.  Carl Best, [facility] 11/21/2022, 1:43 PM Please see Amion for pager number during day hours 7:00am-4:30pm

## 2022-11-21 NOTE — ED Notes (Signed)
Pt reports she has not had anything to eat/drink since last night.

## 2022-11-21 NOTE — ED Triage Notes (Signed)
Pt is here for Right sided abdominal pain and central back pain which is severe and began this am.  Pt was seen at [facility] and had a urinalysis and was told that she had a "light uti" and to come to ED for further evaluation

## 2022-11-21 NOTE — Anesthesia Procedure Notes (Signed)
Procedure Name: Intubation Date/Time: 11/21/2022 2:57 PM  Performed by: Nils Pyle, CRNAPre-anesthesia Checklist: Patient identified, Emergency Drugs available, Suction available and Patient being monitored Patient Re-evaluated:Patient Re-evaluated prior to induction Oxygen Delivery Method: Circle System Utilized Preoxygenation: Pre-oxygenation with 100% oxygen Induction Type: IV induction Ventilation: Mask ventilation without difficulty and Oral airway inserted - appropriate to patient size Laryngoscope Size: Hyacinth Meeker and 2 Grade View: Grade I Tube type: Oral Tube size: 7.0 mm Number of attempts: 1 Airway Equipment and Method: Stylet and Oral airway Placement Confirmation: ETT inserted through vocal cords under direct vision, positive ETCO2 and breath sounds checked- equal and bilateral Secured at: 22 cm Tube secured with: Tape Dental Injury: Teeth and Oropharynx as per pre-operative assessment

## 2022-11-21 NOTE — Discharge Instructions (Addendum)
[facility] ° °Please arrive at least 30 min before your appointment to complete your check in paperwork.  If you are unable to arrive 30 min prior to your appointment time we may have to cancel or reschedule you. °LAPAROSCOPIC SURGERY: POST OP INSTRUCTIONS °Always review your discharge instruction sheet given to you by the facility where your surgery was performed. °IF YOU HAVE DISABILITY OR FAMILY LEAVE FORMS, YOU MUST BRING THEM TO THE OFFICE FOR PROCESSING.   °DO NOT GIVE THEM TO YOUR DOCTOR. ° °PAIN CONTROL ° °First take acetaminophen (Tylenol) AND/or ibuprofen (Advil) to control your pain after surgery.  Follow directions on package.  Taking acetaminophen (Tylenol) and/or ibuprofen (Advil) regularly after surgery will help to control your pain and lower the amount of prescription pain medication you may need.  You should not take more than 4,000 mg (4 grams) of acetaminophen (Tylenol) in 24 hours.  You should not take ibuprofen (Advil), aleve, motrin, naprosyn or other NSAIDS if you have a history of stomach ulcers or chronic kidney disease.  °A prescription for pain medication may be given to you upon discharge.  Take your pain medication as prescribed, if you still have uncontrolled pain after taking acetaminophen (Tylenol) or ibuprofen (Advil). °Use ice packs to help control pain. °If you need a refill on your pain medication, please contact your pharmacy.  They will contact our office to request authorization. Prescriptions will not be filled after 5pm or on week-ends. ° °HOME MEDICATIONS °Take your usually prescribed medications unless otherwise directed. ° °DIET °You should follow a light diet the first few days after arrival home.  Be sure to include lots of fluids daily. Avoid fatty, fried foods.  ° °CONSTIPATION °It is common to experience some constipation after surgery and if you are taking pain medication.  Increasing fluid intake and taking a stool softener (such as Colace)  will usually help or prevent this problem from occurring.  A mild laxative (Milk of Magnesia or Miralax) should be taken according to package instructions if there are no bowel movements after 48 hours. ° °WOUND/INCISION CARE °Most patients will experience some swelling and bruising in the area of the incisions.  Ice packs will help.  Swelling and bruising can take several days to resolve.  °Unless discharge instructions indicate otherwise, follow guidelines below  °STERI-STRIPS - you may remove your outer bandages 48 hours after surgery, and you may shower at that time.  You have steri-strips (small skin tapes) in place directly over the incision.  These strips should be left on the skin for 7-10 days.   °DERMABOND/SKIN GLUE - you may shower in 24 hours.  The glue will flake off over the next 2-3 weeks. °Any sutures or staples will be removed at the office during your follow-up visit. ° °ACTIVITIES °You may resume regular (light) daily activities beginning the next day--such as daily self-care, walking, climbing stairs--gradually increasing activities as tolerated.  You may have sexual intercourse when it is comfortable.  Refrain from any heavy lifting or straining until approved by your doctor. °You may drive when you are no longer taking prescription pain medication, you can comfortably wear a seatbelt, and you can safely maneuver your car and apply brakes. ° °FOLLOW-UP °You should see your doctor in the office for a follow-up appointment approximately 2-3 weeks after your surgery.  You should have been given your post-op/follow-up appointment when your surgery was scheduled.  If you did not receive a post-op/follow-up appointment, make sure   that you call for this appointment within a day or two after you arrive home to insure a convenient appointment time. ° ° °WHEN TO CALL YOUR DOCTOR: °Fever over 101.0 °Inability to urinate °Continued bleeding from incision. °Increased pain, redness, or drainage from the  incision. °Increasing abdominal pain ° °The clinic staff is available to answer your questions during regular business hours.  Please don’t hesitate to call and ask to speak to one of the nurses for clinical concerns.  If you have a medical emergency, go to the nearest emergency room or call 911.  A surgeon from [facility] is always on call at the hospital. °[address], [address], [place], [place]  [address] ? [address] [address], [place], [place]   [address] °[phone] ? [phone] ? FAX [phone] ° ° ° ° °Managing Your Pain After Surgery Without Opioids ° ° ° °Thank you for participating in our program to help patients manage their pain after surgery without opioids. This is part of our effort to provide you with the best care possible, without exposing you or your family to the risk that opioids pose. ° °What pain can I expect after surgery? °You can expect to have some pain after surgery. This is normal. The pain is typically worse the day after surgery, and quickly begins to get better. °Many studies have found that many patients are able to manage their pain after surgery with Over-the-Counter (OTC) medications such as Tylenol and Motrin. If you have a condition that does not allow you to take Tylenol or Motrin, notify your surgical team. ° °How will I manage my pain? °The best strategy for controlling your pain after surgery is around the clock pain control with Tylenol (acetaminophen) and Motrin (ibuprofen or Advil). Alternating these medications with each other allows you to maximize your pain control. In addition to Tylenol and Motrin, you can use heating pads or ice packs on your incisions to help reduce your pain. ° °How will I alternate your regular strength over-the-counter pain medication? °You will take a dose of pain medication every three hours. °Start by taking 650 mg of Tylenol (2 pills of 325 mg) °3 hours later take 600 mg of Motrin (3 pills of 200 mg) °3 hours after  taking the Motrin take 650 mg of Tylenol °3 hours after that take 600 mg of Motrin. ° ° °- 1 - ° °See example - if your first dose of Tylenol is at 12:00 PM ° ° °12:00 PM Tylenol 650 mg (2 pills of 325 mg)  °3:00 PM Motrin 600 mg (3 pills of 200 mg)  °6:00 PM Tylenol 650 mg (2 pills of 325 mg)  °9:00 PM Motrin 600 mg (3 pills of 200 mg)  °Continue alternating every 3 hours  ° °We recommend that you follow this schedule around-the-clock for at least 3 days after surgery, or until you feel that it is no longer needed. Use the table on the last page of this handout to keep track of the medications you are taking. °Important: °Do not take more than 3000mg of Tylenol or 3200mg of Motrin in a 24-hour period. °Do not take ibuprofen/Motrin if you have a history of bleeding stomach ulcers, severe kidney disease, &/or actively taking a blood thinner ° °What if I still have pain? °If you have pain that is not controlled with the over-the-counter pain medications (Tylenol and Motrin or Advil) you might have what we call “breakthrough” pain. You will receive a prescription   for a small amount of an opioid pain medication such as Oxycodone, Tramadol, or Tylenol with Codeine. Use these opioid pills in the first 24 hours after surgery if you have breakthrough pain. Do not take more than 1 pill every 4-6 hours. ° °If you still have uncontrolled pain after using all opioid pills, don't hesitate to call our staff using the number provided. We will help make sure you are managing your pain in the best way possible, and if necessary, we can provide a prescription for additional pain medication. ° ° °Day 1   ° °Time  °Name of Medication Number of pills taken  °Amount of Acetaminophen  °Pain Level  ° °Comments  °AM PM       °AM PM       °AM PM       °AM PM       °AM PM       °AM PM       °AM PM       °AM PM       °Total Daily amount of Acetaminophen °Do not take more than  3,000 mg per day    ° ° °Day 2   ° °Time  °Name of Medication  Number of pills °taken  °Amount of Acetaminophen  °Pain Level  ° °Comments  °AM PM       °AM PM       °AM PM       °AM PM       °AM PM       °AM PM       °AM PM       °AM PM       °Total Daily amount of Acetaminophen °Do not take more than  3,000 mg per day    ° ° °Day 3   ° °Time  °Name of Medication Number of pills taken  °Amount of Acetaminophen  °Pain Level  ° °Comments  °AM PM       °AM PM       °AM PM       °AM PM       ° ° ° °AM PM       °AM PM       °AM PM       °AM PM       °Total Daily amount of Acetaminophen °Do not take more than  3,000 mg per day    ° ° °Day 4   ° °Time  °Name of Medication Number of pills taken  °Amount of Acetaminophen  °Pain Level  ° °Comments  °AM PM       °AM PM       °AM PM       °AM PM       °AM PM       °AM PM       °AM PM       °AM PM       °Total Daily amount of Acetaminophen °Do not take more than  3,000 mg per day    ° ° °Day 5   ° °Time  °Name of Medication Number °of pills taken  °Amount of Acetaminophen  °Pain Level  ° °Comments  °AM PM       °AM PM       °AM PM       °AM PM       °AM PM       °AM   PM       °AM PM       °AM PM       °Total Daily amount of Acetaminophen °Do not take more than  3,000 mg per day    ° ° ° °Day 6   ° °Time  °Name of Medication Number of pills °taken  °Amount of Acetaminophen  °Pain Level  °Comments  °AM PM       °AM PM       °AM PM       °AM PM       °AM PM       °AM PM       °AM PM       °AM PM       °Total Daily amount of Acetaminophen °Do not take more than  3,000 mg per day    ° ° °Day 7   ° °Time  °Name of Medication Number of pills taken  °Amount of Acetaminophen  °Pain Level  ° °Comments  °AM PM       °AM PM       °AM PM       °AM PM       °AM PM       °AM PM       °AM PM       °AM PM       °Total Daily amount of Acetaminophen °Do not take more than  3,000 mg per day    ° ° ° ° °For additional information about how and where to safely dispose of unused opioid °medications - https://www.morepowerfulnc.org ° °Disclaimer: This document  contains information and/or instructional materials adapted from [facility] for the typical patient with your condition. It does not replace medical advice from your health care provider because your experience may differ from that of the °typical patient. Talk to your health care provider if you have any questions about this °document, your condition or your treatment plan. °Adapted from [facility] ° °

## 2022-11-21 NOTE — Op Note (Signed)
Laparoscopic Cholecystectomy with IOC Procedure Note  Indications: This patient presents with acute cholecystitis and will undergo laparoscopic cholecystectomy.  Pre-operative Diagnosis: Calculus of gallbladder with acute cholecystitis, without mention of obstruction  Post-operative Diagnosis: Same  Surgeon: Wynona Luna   Assistants: None  Anesthesia: General endotracheal anesthesia  ASA Class: 2  Procedure Details  The patient was seen again in the Holding Room. The risks, benefits, complications, treatment options, and expected outcomes were discussed with the patient. The possibilities of reaction to medication, pulmonary aspiration, perforation of viscus, bleeding, recurrent infection, finding a normal gallbladder, the need for additional procedures, failure to diagnose a condition, the possible need to convert to an open procedure, and creating a complication requiring transfusion or operation were discussed with the patient. The likelihood of improving the patient's symptoms with return to their baseline status is good.  The patient and/or family concurred with the proposed plan, giving informed consent. The site of surgery properly noted. The patient was taken to Operating Room, identified as Erika Foley and the procedure verified as Laparoscopic Cholecystectomy with Intraoperative Cholangiogram. A Time Out was held and the above information confirmed.  Prior to the induction of general anesthesia, antibiotic prophylaxis was administered. General endotracheal anesthesia was then administered and tolerated well. After the induction, the abdomen was prepped with Chloraprep and draped in the sterile fashion. The patient was positioned in the supine position.  Local anesthetic agent was injected into the skin near the umbilicus and an incision made. We dissected down to the abdominal fascia with blunt dissection.  The fascia was incised vertically and we entered the peritoneal cavity  bluntly.  A pursestring suture of 0-Vicryl was placed around the fascial opening.  The Hasson cannula was inserted and secured with the stay suture.  Pneumoperitoneum was then created with CO2 and tolerated well without any adverse changes in the patient's vital signs. An 11-mm port was placed in the subxiphoid position.  Two 5-mm ports were placed in the right upper quadrant. All skin incisions were infiltrated with a local anesthetic agent before making the incision and placing the trocars.   We positioned the patient in reverse Trendelenburg, tilted slightly to the patient's left.  The gallbladder was identified, the fundus grasped and retracted cephalad. There are omental adhesions to the gallbladder and the gallbladder wall is quite edematous.  Adhesions were lysed bluntly and with the electrocautery where indicated, taking care not to injure any adjacent organs or viscus. The infundibulum was grasped and retracted laterally, exposing the peritoneum overlying the triangle of Calot. This was then divided and exposed in a blunt fashion. A critical view of the cystic duct and cystic artery was obtained.  The cystic duct was clearly identified and bluntly dissected circumferentially. The cystic duct was ligated with a clip distally.   An incision was made in the cystic duct and the [facility] cholangiogram catheter introduced. The catheter was secured using a clip. A cholangiogram was then obtained which showed good visualization of the distal and proximal biliary tree with no sign of filling defects or obstruction.  Contrast flowed easily into the duodenum. The catheter was then removed.   The cystic duct was then ligated with clips and divided. The cystic artery was identified, dissected free, ligated with clips and divided as well.   The gallbladder was dissected from the liver bed in retrograde fashion with the electrocautery. The gallbladder was removed and placed in an Eco sac. The liver bed was irrigated and  inspected. Hemostasis  was achieved with the electrocautery. Copious irrigation was utilized and was repeatedly aspirated until clear.  The gallbladder and Eco sac were then removed through the umbilical port site.  The pursestring suture was used to close the umbilical fascia.    We again inspected the right upper quadrant for hemostasis.  Pneumoperitoneum was released as we removed the trocars.  4-0 Monocryl was used to close the skin.   Benzoin, steri-strips, and clean dressings were applied. The patient was then extubated and brought to the recovery room in stable condition. Instrument, sponge, and needle counts were correct at closure and at the conclusion of the case.   Findings: Cholecystitis with Cholelithiasis  Estimated Blood Loss: less than 50 mL         Drains: none         Specimens: Gallbladder           Complications: None; patient tolerated the procedure well.         Disposition: PACU - hemodynamically stable.         Condition: stable  Erika Foley. Corliss Skains, MD, [facility] Surgery  General Surgery   11/21/2022 4:13 PM

## 2022-11-21 NOTE — Transfer of Care (Signed)
Immediate Anesthesia Transfer of Care Note  Patient: Erika Foley  Procedure(s) Performed: LAPAROSCOPIC CHOLECYSTECTOMY (Abdomen) INTRAOPERATIVE CHOLANGIOGRAM (Abdomen)  Patient Location: PACU  Anesthesia Type:General  Level of Consciousness: awake, alert , and oriented  Airway & Oxygen Therapy: Patient Spontanous Breathing  Post-op Assessment: Report given to RN, Post -op Vital signs reviewed and stable, and Patient moving all extremities X 4  Post vital signs: Reviewed and stable  Last Vitals:  Vitals Value Taken Time  BP 123/105   Temp    Pulse 89 11/21/22 1621  Resp 16 11/21/22 1621  SpO2 96 % 11/21/22 1621  Vitals shown include unvalidated device data.  Last Pain:  Vitals:   11/21/22 1431  TempSrc:   PainSc: 5          Complications: No notable events documented.

## 2022-11-21 NOTE — Plan of Care (Signed)

## 2022-11-21 NOTE — Anesthesia Preprocedure Evaluation (Addendum)
Anesthesia Evaluation  Patient identified by MRN, date of birth, ID band Patient awake    Reviewed: Allergy & Precautions, H&P , NPO status , Patient's Chart, lab work & pertinent test results  Airway Mallampati: III  TM Distance: >3 FB Neck ROM: Full    Dental no notable dental hx. (+) Teeth Intact, Dental Advisory Given   Pulmonary former smoker   Pulmonary exam normal breath sounds clear to auscultation       Cardiovascular hypertension,  Rhythm:Regular Rate:Normal     Neuro/Psych negative neurological ROS  negative psych ROS   GI/Hepatic negative GI ROS, Neg liver ROS,,,  Endo/Other    Morbid obesity  Renal/GU negative Renal ROS  negative genitourinary   Musculoskeletal   Abdominal   Peds  Hematology negative hematology ROS (+)   Anesthesia Other Findings   Reproductive/Obstetrics negative OB ROS                             Anesthesia Physical Anesthesia Plan  ASA: 2  Anesthesia Plan: General   Post-op Pain Management: Ofirmev IV (intra-op)* and Toradol IV (intra-op)*   Induction: Intravenous  PONV Risk Score and Plan: 4 or greater and Ondansetron, Dexamethasone and Midazolam  Airway Management Planned: Oral ETT  Additional Equipment:   Intra-op Plan:   Post-operative Plan: Extubation in OR  Informed Consent: I have reviewed the patients History and Physical, chart, labs and discussed the procedure including the risks, benefits and alternatives for the proposed anesthesia with the patient or authorized representative who has indicated his/her understanding and acceptance.     Dental advisory given  Plan Discussed with: CRNA  Anesthesia Plan Comments:        Anesthesia Quick Evaluation

## 2022-11-21 NOTE — ED Notes (Signed)
Pt being taken to short stay bay 37 via ED tech via stretcher

## 2022-11-21 NOTE — Anesthesia Postprocedure Evaluation (Signed)
Anesthesia Post Note  Patient: [name]  Procedure(s) Performed: LAPAROSCOPIC CHOLECYSTECTOMY (Abdomen) INTRAOPERATIVE CHOLANGIOGRAM (Abdomen)     Patient location during evaluation: PACU Anesthesia Type: General Level of consciousness: awake and alert Pain management: pain level controlled Vital Signs Assessment: post-procedure vital signs reviewed and stable Respiratory status: spontaneous breathing, nonlabored ventilation and respiratory function stable Cardiovascular status: blood pressure returned to baseline and stable Postop Assessment: no apparent nausea or vomiting Anesthetic complications: no  No notable events documented.  Last Vitals:  Vitals:   11/21/22 1730 11/21/22 1745  BP: (!) 159/75 (!) 145/78  Pulse: 79 79  Resp: 18 18  Temp:  36.9 C  SpO2: 97% 94%    Last Pain:  Vitals:   11/21/22 1658  TempSrc:   PainSc: 10-Worst pain ever                 Elisabetta Mishra,W. EDMOND

## 2022-11-22 ENCOUNTER — Other Ambulatory Visit (HOSPITAL_COMMUNITY): Payer: Self-pay

## 2022-11-22 ENCOUNTER — Encounter (HOSPITAL_COMMUNITY): Payer: Self-pay | Admitting: Surgery

## 2022-11-22 LAB — CBC
HCT: 31.8 % — ABNORMAL LOW (ref 36.0–46.0)
Hemoglobin: 9.6 g/dL — ABNORMAL LOW (ref 12.0–15.0)
MCH: 21.5 pg — ABNORMAL LOW (ref 26.0–34.0)
MCHC: 30.2 g/dL (ref 30.0–36.0)
MCV: 71.1 fL — ABNORMAL LOW (ref 80.0–100.0)
Platelets: 476 10*3/uL — ABNORMAL HIGH (ref 150–400)
RBC: 4.47 MIL/uL (ref 3.87–5.11)
RDW: 17.7 % — ABNORMAL HIGH (ref 11.5–15.5)
WBC: 17.6 10*3/uL — ABNORMAL HIGH (ref 4.0–10.5)
nRBC: 0 % (ref 0.0–0.2)

## 2022-11-22 LAB — COMPREHENSIVE METABOLIC PANEL
ALT: 31 U/L (ref 0–44)
AST: 43 U/L — ABNORMAL HIGH (ref 15–41)
Albumin: 3.4 g/dL — ABNORMAL LOW (ref 3.5–5.0)
Alkaline Phosphatase: 56 U/L (ref 38–126)
Anion gap: 8 (ref 5–15)
BUN: 9 mg/dL (ref 6–20)
CO2: 21 mmol/L — ABNORMAL LOW (ref 22–32)
Calcium: 8.5 mg/dL — ABNORMAL LOW (ref 8.9–10.3)
Chloride: 107 mmol/L (ref 98–111)
Creatinine, Ser: 0.7 mg/dL (ref 0.44–1.00)
GFR, Estimated: >60 mL/min (ref >60)
Glucose, Bld: 115 mg/dL — ABNORMAL HIGH (ref 70–99)
Potassium: 4 mmol/L (ref 3.5–5.1)
Sodium: 136 mmol/L (ref 135–145)
Total Bilirubin: 0.5 mg/dL (ref 0.3–1.2)
Total Protein: 6.3 g/dL — ABNORMAL LOW (ref 6.5–8.1)

## 2022-11-22 MED ORDER — METHOCARBAMOL 500 MG PO TABS
500.0000 mg | ORAL_TABLET | Freq: Four times a day (QID) | ORAL | Status: DC
  Administered 2022-11-22: 500 mg via ORAL
  Filled 2022-11-22: qty 1

## 2022-11-22 MED ORDER — METHOCARBAMOL 500 MG PO TABS
500.0000 mg | ORAL_TABLET | Freq: Four times a day (QID) | ORAL | 0 refills | Status: AC | PRN
  Filled 2022-11-22: qty 12, 3d supply, fill #0

## 2022-11-22 NOTE — Progress Notes (Signed)
Progress Note  1 Day Post-Op  Subjective: Some poor pain control overnight. Has not had regular food yet. Ambulated in room. No n/v   Objective: Vital signs in last 24 hours: Temp:  [97.6 F (36.4 C)-98.5 F (36.9 C)] 98.2 F (36.8 C) (05/17 0720) Pulse Rate:  [65-88] 65 (05/17 0720) Resp:  [13-20] 16 (05/17 0720) BP: (123-194)/(69-105) 135/69 (05/17 0720) SpO2:  [92 %-100 %] 96 % (05/17 0720) Weight:  [97.5 kg-98 kg] 98 kg (05/16 1419) Last BM Date : 11/20/22  Intake/Output from previous day: 05/16 0701 - 05/17 0700 In: 2855.9 [P.O.:240; I.V.:1415.9; IV Piggyback:1200] Out: 10 [Blood:10] Intake/Output this shift: No intake/output data recorded.  PE: General: pleasant, WD, female who is laying in bed in NAD Lungs: Respiratory effort nonlabored Abd: soft, ND, appropriate TTP over incisions which are cdi with gauze and Tegaderm MSK: all 4 extremities are symmetrical with no cyanosis, clubbing, or edema. Skin: warm and dry Psych: A&Ox3 with an appropriate affect.    Lab Results:  Recent Labs    11/21/22 1019 11/22/22 0619  WBC 11.9* 17.6*  HGB 10.7* 9.6*  HCT 36.1 31.8*  PLT 506* 476*   BMET Recent Labs    11/21/22 1019 11/22/22 0619  NA 140 136  K 4.1 4.0  CL 106 107  CO2 21* 21*  GLUCOSE 97 115*  BUN 15 9  CREATININE 0.77 0.70  CALCIUM 9.2 8.5*   PT/INR No results for input(s): "LABPROT", "INR" in the last 72 hours. CMP     Component Value Date/Time   NA 136 11/22/2022 0619   NA 143 11/30/2011 0523   K 4.0 11/22/2022 0619   K 3.2 (L) 11/30/2011 0523   CL 107 11/22/2022 0619   CL 106 11/30/2011 0523   CO2 21 (L) 11/22/2022 0619   CO2 24 11/30/2011 0523   GLUCOSE 115 (H) 11/22/2022 0619   GLUCOSE 119 (H) 11/30/2011 0523   BUN 9 11/22/2022 0619   BUN 13 11/30/2011 0523   CREATININE 0.70 11/22/2022 0619   CREATININE 0.78 11/30/2011 0523   CALCIUM 8.5 (L) 11/22/2022 0619   CALCIUM 8.6 11/30/2011 0523   PROT 6.3 (L) 11/22/2022 0619    ALBUMIN 3.4 (L) 11/22/2022 0619   AST 43 (H) 11/22/2022 0619   ALT 31 11/22/2022 0619   ALKPHOS 56 11/22/2022 0619   BILITOT 0.5 11/22/2022 0619   GFRNONAA >60 11/22/2022 0619   GFRNONAA >60 11/30/2011 0523   GFRAA >60 11/30/2011 0523   Lipase     Component Value Date/Time   LIPASE 31 11/21/2022 1019       Studies/Results: DG Cholangiogram Operative  Result Date: 11/21/2022 CLINICAL DATA:  Cholecystectomy for acute cholecystitis. EXAM: INTRAOPERATIVE CHOLANGIOGRAM TECHNIQUE: Cholangiographic images from the C-arm fluoroscopic device were submitted for interpretation post-operatively. Please see the procedural report for the amount of contrast and the fluoroscopy time utilized. FLUOROSCOPY: Radiation Exposure Index (as provided by the fluoroscopic device): 6.7 mGy Kerma COMPARISON:  CT of the abdomen earlier today. FINDINGS: Intraoperative imaging with a C-arm demonstrates normal opacified cystic duct stump, common bile duct and visualized intrahepatic ducts. No evidence of biliary dilatation, stricture, filling defect or contrast extravasation. Contrast enters the duodenum normally. IMPRESSION: Normal intraoperative cholangiogram. Electronically Signed   By: Irish Lack M.D.   On: 11/21/2022 16:08   CT ABDOMEN PELVIS W CONTRAST  Result Date: 11/21/2022 CLINICAL DATA:  Acute abdominal pain. Epigastric, RIGHT UPPER QUADRANT, and RIGHT flank pain of sudden on set. EXAM: CT ABDOMEN AND  PELVIS WITH CONTRAST TECHNIQUE: Multidetector CT imaging of the abdomen and pelvis was performed using the standard protocol following bolus administration of intravenous contrast. RADIATION DOSE REDUCTION: This exam was performed according to the departmental dose-optimization program which includes automated exposure control, adjustment of the mA and/or kV according to patient size and/or use of iterative reconstruction technique. CONTRAST:  75mL OMNIPAQUE IOHEXOL 350 MG/ML SOLN COMPARISON:  None Available.  FINDINGS: Lower chest: No acute abnormality. Hepatobiliary: Liver is homogeneous. The gallbladder is distended and has a thickened wall measuring 1 centimeter. Alternatively, this could represent pericholecystic fluid. No radiopaque calculi are identified. No intra or extrahepatic biliary duct dilatation. Pancreas: Unremarkable. No pancreatic ductal dilatation or surrounding inflammatory changes. Spleen: Normal in size without focal abnormality. Adrenals/Urinary Tract: Adrenal glands are normal. Kidneys are symmetric in size and enhancement. No hydronephrosis or suspicious renal mass. Ureters and urinary bladder are unremarkable. Stomach/Bowel: Small hiatal hernia. Stomach is otherwise normal in appearance. Small bowel loops are normal in caliber and wall thickness. The There are numerous colonic diverticula, not associated with acute inflammatory changes. Appendix is normal in appearance. The terminal ileum is distended by stool but otherwise normal in appearance. Vascular/Lymphatic: No significant vascular findings are present. No enlarged abdominal or pelvic lymph nodes. Reproductive: The uterus is present. Normal appearance of both ovaries. Other: Small fat containing paraumbilical hernia.  No ascites. Musculoskeletal: 2 there are mild degenerative changes in the LOWER thoracic and lumbar spine. Moderate disc height loss at L5-S1. IMPRESSION: 1. Gallbladder wall thickening versus pericholecystic fluid. Findings are suspicious for acute cholecystitis. Consider further evaluation with ultrasound. 2. Small hiatal hernia. 3. Colonic diverticulosis without evidence for acute diverticulitis. 4. Small fat containing paraumbilical hernia. 5. Moderate degenerative disc disease at L5-S1. Electronically Signed   By: Norva Pavlov M.D.   On: 11/21/2022 13:09   DG Thoracic Spine 2 View  Result Date: 11/21/2022 CLINICAL DATA:  Back EXAM: THORACIC SPINE 2 VIEWS COMPARISON:  None Available. FINDINGS: There is no evidence  of thoracic spine fracture. No listhesis. No other significant bone abnormalities are identified. IMPRESSION: Negative. Electronically Signed   By: Wiliam Ke M.D.   On: 11/21/2022 11:35    Anti-infectives: Anti-infectives (From admission, onward)    Start     Dose/Rate Route Frequency Ordered Stop   11/22/22 1000  cefTRIAXone (ROCEPHIN) 2 g in sodium chloride 0.9 % 100 mL IVPB        2 g 200 mL/hr over 30 Minutes Intravenous Every 24 hours 11/21/22 1813 11/27/22 0959   11/22/22 0600  ciprofloxacin (CIPRO) IVPB 400 mg  Status:  Discontinued        400 mg 200 mL/hr over 60 Minutes Intravenous On call to O.R. 11/21/22 1427 11/21/22 1428   11/22/22 0600  cefTRIAXone (ROCEPHIN) 2 g in sodium chloride 0.9 % 100 mL IVPB        2 g 200 mL/hr over 30 Minutes Intravenous On call to O.R. 11/21/22 1428 11/21/22 1530        Assessment/Plan Acute cholecystitis  POD1 s/p lap chole with IOC 5/16 Dr. Corliss Skains  - some poor pain control overnight - add schd robaxin - reg diet, ambulate. Likely dc around lunchtime  FEN: reg ID: rocephin VTE: lovenox    LOS: 0 days   Eric Form, [facility] Surgery 11/22/2022, 8:19 AM Please see Amion for pager number during day hours 7:00am-4:30pm

## 2022-11-22 NOTE — Progress Notes (Signed)
Discharge instructions reviewed with pt and family at bedside.  Copy of instructions given to pt. [facility] filled scripts yesterday and today. Oxycodone script filled yesterday 5/16 and family took med home last night, today pharmacy filled her robaxin and it was delivered to her in her room before discharge.    Pt d/c'd via wheelchair with belongings, with family.             Escorted by staff/hospital volunteer.   Quinnten Calvin,RN SWOT

## 2022-11-25 LAB — SURGICAL PATHOLOGY

## 2022-11-26 ENCOUNTER — Institutional Professional Consult (permissible substitution): Payer: BC Managed Care – PPO | Admitting: Internal Medicine

## 2022-11-29 ENCOUNTER — Encounter: Payer: Self-pay | Admitting: Internal Medicine

## 2022-11-29 DIAGNOSIS — G43719 Chronic migraine without aura, intractable, without status migrainosus: Secondary | ICD-10-CM | POA: Diagnosis not present

## 2022-11-29 DIAGNOSIS — G518 Other disorders of facial nerve: Secondary | ICD-10-CM | POA: Diagnosis not present

## 2022-11-29 DIAGNOSIS — M791 Myalgia, unspecified site: Secondary | ICD-10-CM | POA: Diagnosis not present

## 2022-11-29 DIAGNOSIS — M542 Cervicalgia: Secondary | ICD-10-CM | POA: Diagnosis not present

## 2022-12-03 NOTE — Discharge Summary (Signed)
[facility] Discharge Summary   Patient ID: Erika Foley MRN: [id number] DOB/AGE: November 03, 1980 42 y.o.  Admit date: 11/21/2022 Discharge date: 11/22/2022  Admitting Diagnosis: RUQ abdominal pain [R10.11] Acute cholecystitis due to biliary calculus [K80.00]   Discharge Diagnosis RUQ abdominal pain [R10.11] Acute cholecystitis due to biliary calculus [K80.00]  S/p laparoscopic cholecystectomy  Consultants None  Imaging: No results found.  Procedures Dr. Corliss Skains (11/21/22) - Laparoscopic Cholecystectomy with [facility]   Hospital Course:  42 y.o. female who presented to ED with abdominal pain.  Workup showed acute cholecystitis.  Patient was admitted and underwent procedure listed above.  Tolerated procedure well and was transferred to the floor.  Diet was advanced as tolerated.  On POD11, the patient was voiding well, tolerating diet, ambulating well, pain well controlled, vital signs stable, incisions c/d/i and felt stable for discharge home.  Patient will follow up in our office in 3 weeks and knows to call with questions or concerns.    I or a member of my team have reviewed this patient in the Controlled Substance Database.   Allergies as of 11/22/2022       Reactions   Amoxicillin Hives        Medication List     STOP taking these medications    HYDROcodone-acetaminophen 5-325 MG tablet Commonly known as: Norco   ibuprofen 800 MG tablet Commonly known as: ADVIL       TAKE these medications    acetaminophen 500 MG tablet Commonly known as: TYLENOL Take 2 tablets (1,000 mg total) by mouth every 8 (eight) hours as needed.   esomeprazole 40 MG capsule Commonly known as: NEXIUM Take 40 mg by mouth daily at 12 noon.   oxyCODONE 5 MG immediate release tablet Commonly known as: Roxicodone Take 1 tablet (5 mg total) by mouth every 6 (six) hours as needed for breakthrough pain.       ASK your doctor about these medications    ibuprofen 800 MG  tablet Commonly known as: ADVIL Take 1 tablet (800 mg total) by mouth every 8 (eight) hours as needed for up to 5 days. Ask about: Should I take this medication?   methocarbamol 500 MG tablet Commonly known as: ROBAXIN Take 1 tablet (500 mg total) by mouth every 6 (six) hours as needed for up to 3 days for muscle spasms (pain). Ask about: Should I take this medication?          Follow-up Information     Maczis, Hedda Slade, [place]. Call on 12/19/2022.   Specialty: General Surgery Why: 12/19/22 at 1:45 pm. Please arrive 30 minutes prior to your appointment for paperwork. Please bring a copy of your photo ID and insurance card. Contact information: [address] [place] [address] [phone]                 Signed: Clarise Cruz [facility] Surgery 12/03/2022, 10:01 AM Please see Amion for pager number during day hours 7:00am-4:30pm

## 2022-12-06 ENCOUNTER — Telehealth: Payer: Self-pay

## 2022-12-06 NOTE — Telephone Encounter (Signed)
Pt verified New pt appt date/time

## 2022-12-10 ENCOUNTER — Ambulatory Visit: Payer: BC Managed Care – PPO | Admitting: Obstetrics & Gynecology

## 2022-12-10 ENCOUNTER — Other Ambulatory Visit (HOSPITAL_COMMUNITY)
Admission: RE | Admit: 2022-12-10 | Discharge: 2022-12-10 | Disposition: A | Discharge status: Home / Self Care | Payer: BC Managed Care – PPO | Source: Ambulatory Visit | Attending: Obstetrics & Gynecology | Admitting: Obstetrics & Gynecology

## 2022-12-10 ENCOUNTER — Encounter: Payer: Self-pay | Admitting: Obstetrics & Gynecology

## 2022-12-10 VITALS — BP 147/94 | HR 93 | Ht 61.0 in | Wt 217.0 lb

## 2022-12-10 DIAGNOSIS — N920 Excessive and frequent menstruation with regular cycle: Secondary | ICD-10-CM

## 2022-12-10 DIAGNOSIS — Z01419 Encounter for gynecological examination (general) (routine) without abnormal findings: Secondary | ICD-10-CM

## 2022-12-10 DIAGNOSIS — Z1331 Encounter for screening for depression: Secondary | ICD-10-CM | POA: Diagnosis not present

## 2022-12-10 DIAGNOSIS — I10 Essential (primary) hypertension: Secondary | ICD-10-CM | POA: Diagnosis not present

## 2022-12-10 MED ORDER — LISINOPRIL 10 MG PO TABS
10.0000 mg | ORAL_TABLET | Freq: Every day | ORAL | 2 refills | Status: AC
Start: 2022-12-10 — End: ?

## 2022-12-10 MED ORDER — MEGESTROL ACETATE 40 MG PO TABS
80.0000 mg | ORAL_TABLET | Freq: Every day | ORAL | 5 refills | Status: DC
Start: 2022-12-10 — End: 2023-03-13

## 2022-12-10 NOTE — Progress Notes (Signed)
GYNECOLOGY ANNUAL PREVENTATIVE CARE ENCOUNTER NOTE  History:     Erika Foley is a 42 y.o. G2P2 female here for a routine annual gynecologic exam.  Current complaints: heavy menstrual periods associated with pain for the last several years. Has used OCPs in the past, two separate regimens but this did not help.  Wants to discuss other options.   Denies abnormal vaginal discharge, pelvic pain, problems with intercourse or other gynecologic concerns.    Gynecologic History Patient's last menstrual period was 11/14/2022 (exact date). Contraception: none Last Pap: 04/05/2020. Result was normal with negative HPV  Obstetric History OB History  No obstetric history on file.    Past Medical History:  Diagnosis Date   Acute cholecystitis due to biliary calculus 11/21/2022   Hypertension     Past Surgical History:  Procedure Laterality Date   CHOLECYSTECTOMY N/A 11/21/2022   Procedure: LAPAROSCOPIC CHOLECYSTECTOMY;  Surgeon: Manus Rudd, MD;  Location: [facility];  Service: General;  Laterality: N/A;   INTRAOPERATIVE CHOLANGIOGRAM N/A 11/21/2022   Procedure: INTRAOPERATIVE CHOLANGIOGRAM;  Surgeon: Manus Rudd, MD;  Location: [facility] OR;  Service: General;  Laterality: N/A;    Current Outpatient Medications on File Prior to Visit  Medication Sig Dispense Refill   acetaminophen (TYLENOL) 500 MG tablet Take 2 tablets (1,000 mg total) by mouth every 8 (eight) hours as needed. 30 tablet 0   esomeprazole (NEXIUM) 40 MG capsule Take 40 mg by mouth daily at 12 noon.     oxyCODONE (ROXICODONE) 5 MG immediate release tablet Take 1 tablet (5 mg total) by mouth every 6 (six) hours as needed for breakthrough pain. 15 tablet 0   No current facility-administered medications on file prior to visit.    Allergies  Allergen Reactions   Amoxicillin Hives    Social History:  reports that she has quit smoking. She has never used smokeless tobacco. She reports that she does not drink alcohol and does not  use drugs.  History reviewed. No pertinent family history.  The following portions of the patient's history were reviewed and updated as appropriate: allergies, current medications, past family history, past medical history, past social history, past surgical history and problem list.  Review of Systems Pertinent items noted in HPI and remainder of comprehensive ROS otherwise negative.  Physical Exam:  BP (!) 147/94   Pulse 93   Ht 5\' 1"  (1.549 m)   Wt 217 lb (98.4 kg)   LMP 11/14/2022 (Exact Date)   BMI 41.00 kg/m  Vitals:   12/10/22 1315 12/10/22 1409  BP: (!) 151/90 (!) 147/94   CONSTITUTIONAL: Well-developed, well-nourished female in no acute distress.  HENT:  Normocephalic, atraumatic, External right and left ear normal.  EYES: Conjunctivae and EOM are normal. Pupils are equal, round, and reactive to light. No scleral icterus.  NECK: Normal range of motion, supple, no masses.  Normal thyroid.  SKIN: Skin is warm and dry. No rash noted. Not diaphoretic. No erythema. No pallor. MUSCULOSKELETAL: Normal range of motion. No tenderness.  No cyanosis, clubbing, or edema. NEUROLOGIC: Alert and oriented to person, place, and time. Normal reflexes, muscle tone coordination.  PSYCHIATRIC: Normal mood and affect. Normal behavior. Normal judgment and thought content. CARDIOVASCULAR: Normal heart rate noted, regular rhythm RESPIRATORY: Clear to auscultation bilaterally. Effort and breath sounds normal, no problems with respiration noted. BREASTS: Symmetric in size. No masses, tenderness, skin changes, nipple drainage, or lymphadenopathy bilaterally. Performed in the presence of a chaperone. ABDOMEN: Soft, no distention noted.  No  tenderness, rebound or guarding.  PELVIC: Normal appearing external genitalia and urethral meatus; normal appearing vaginal mucosa and cervix.  No abnormal vaginal discharge noted.  Pap smear obtained.  Normal uterine size, no other palpable masses, no uterine or  adnexal tenderness.  Performed in the presence of a chaperone.   Assessment and Plan:    1. Primary hypertension Has lost her PCP. Given her history and elevated BP, Lisinopril ordered.  Referral to PCP made, she will follow up with them. - lisinopril (PRINIVIL) 10 MG tablet; Take 1 tablet (10 mg total) by mouth daily.  Dispense: 30 tablet; Refill: 2 - Ambulatory Referral to Primary Care  2. Menorrhagia with regular cycle Patient has abnormal uterine bleeding history, negative lab evaluation in the past. She has a normal exam, no evidence of lesions.  Will order pelvic ultrasound to evaluate for any structural gynecologic abnormalities.  Discussed management options for abnormal uterine bleeding including NSAIDs (Naproxen), tranexamic acid (Lysteda), oral progesterone, Depo Provera, Levonogestrel IUD, endometrial ablation or hysterectomy as definitive surgical management.  Discussed risks and benefits of each method.   Patient desires oral progestins for now, will consider progestin IUD.  Printed patient education handouts were given to the patient to review at home.  Megace prescribed for now,  bleeding precautions reviewed.  - megestrol (MEGACE) 40 MG tablet; Take 2 tablets (80 mg total) by mouth daily. Can increase to two tablets twice a day in the event of heavy bleeding  Dispense: 60 tablet; Refill: 5 - US PELVIC COMPLETE WITH TRANSVAGINAL; Future  3. Well woman exam with routine gynecological exam - Cytology - PAP Will follow up results of pap smear and manage accordingly. Routine preventative health maintenance measures emphasized. Please refer to After Visit Summary for other counseling recommendations.      Jaynie Collins, MD, FACOG Obstetrician & Gynecologist, [facility] for [employer], [facility]

## 2022-12-16 LAB — CYTOLOGY - PAP
Comment: NEGATIVE
Diagnosis: NEGATIVE
Diagnosis: REACTIVE
High risk HPV: NEGATIVE

## 2022-12-17 ENCOUNTER — Ambulatory Visit
Admission: RE | Admit: 2022-12-17 | Discharge: 2022-12-17 | Disposition: A | Discharge status: Home / Self Care | Payer: BC Managed Care – PPO | Source: Ambulatory Visit | Attending: Obstetrics & Gynecology | Admitting: Obstetrics & Gynecology

## 2022-12-17 DIAGNOSIS — N920 Excessive and frequent menstruation with regular cycle: Secondary | ICD-10-CM | POA: Diagnosis not present

## 2022-12-17 DIAGNOSIS — G5603 Carpal tunnel syndrome, bilateral upper limbs: Secondary | ICD-10-CM | POA: Diagnosis not present

## 2022-12-24 ENCOUNTER — Encounter: Payer: Self-pay | Admitting: Obstetrics and Gynecology

## 2022-12-24 ENCOUNTER — Ambulatory Visit (INDEPENDENT_AMBULATORY_CARE_PROVIDER_SITE_OTHER): Payer: BC Managed Care – PPO | Admitting: Obstetrics and Gynecology

## 2022-12-24 VITALS — BP 139/98 | HR 98 | Wt 217.6 lb

## 2022-12-24 DIAGNOSIS — Z30430 Encounter for insertion of intrauterine contraceptive device: Secondary | ICD-10-CM | POA: Diagnosis not present

## 2022-12-24 DIAGNOSIS — Z3202 Encounter for pregnancy test, result negative: Secondary | ICD-10-CM

## 2022-12-24 LAB — POCT URINE PREGNANCY: Preg Test, Ur: NEGATIVE

## 2022-12-24 MED ORDER — LEVONORGESTREL 20 MCG/DAY IU IUD
1.0000 | INTRAUTERINE_SYSTEM | Freq: Once | INTRAUTERINE | Status: AC
Start: 2022-12-24 — End: 2022-12-24
  Administered 2022-12-24: 1 via INTRAUTERINE

## 2022-12-24 MED ORDER — TRANEXAMIC ACID 650 MG PO TABS
1300.0000 mg | ORAL_TABLET | Freq: Three times a day (TID) | ORAL | 2 refills | Status: DC
Start: 2022-12-24 — End: 2024-02-24

## 2022-12-24 MED ORDER — IBUPROFEN 200 MG PO TABS
800.0000 mg | ORAL_TABLET | Freq: Once | ORAL | Status: AC
Start: 2022-12-24 — End: 2022-12-24
  Administered 2022-12-24: 800 mg via ORAL

## 2022-12-24 NOTE — Progress Notes (Signed)
CC: discuss Ultrasound results  Wants to discuss Mirena  For period and cramping   LMP: 12/19/22 - last day (7-8 days)  Unprotected Sex : 12/22/22   Hasn't started Megace from Dr.A due to starting period.

## 2022-12-24 NOTE — Progress Notes (Signed)
    GYNECOLOGY OFFICE PROCEDURE NOTE  Erika Foley is a 42 y.o. [id number] here for IUD insertion. No GYN concerns.  Last pap smear was on 12/10/2022 and was normal. Reviewed normal US findings. Confirmed she wanted to proceed with IUD. Will do TXA to bridge. She did not start the Megace.   IUD Insertion Procedure Note Procedure: IUD insertion with Mirena UPT: Negative  Patient identified.  Risks, benefits and alternatives of procedure were discussed including irregular bleeding, cramping, infection, malpositioning or misplacement of the IUD outside the uterus which may require further procedure such as laparoscopy. Also discussed >99% contraception efficacy, increased risk of ectopic pregnancy with failure of method.   Emphasized that this did not protect against STIs, condoms recommended during all sexual encounters. Consent signed. Time out performed.   Speculum inserted and cervix visualized, prepped with 3 swabs of betadine.   Grasped with a single tooth tenaculum and IUD then inserted without difficulty per manufacturer's instructions and strings cut to 3 cm below cervical os and all instruments removed. Pt tolerated well with minimal pain and bleeding.   Discussed concerning signs/symptoms and to call if heavy bleeding, severe abdominal pain, or fever in the following 3 weeks. Manufacturer pamphlet/patient information given. Reviewed timing of efficacy for contraception and to use an alternative form of birth control until that time.   Milas Hock, MD, FACOG Obstetrician & Gynecologist, [facility] for [facility], [facility]

## 2022-12-31 DIAGNOSIS — R070 Pain in throat: Secondary | ICD-10-CM | POA: Diagnosis not present

## 2022-12-31 DIAGNOSIS — J039 Acute tonsillitis, unspecified: Secondary | ICD-10-CM | POA: Diagnosis not present

## 2023-01-02 ENCOUNTER — Institutional Professional Consult (permissible substitution): Payer: BC Managed Care – PPO | Admitting: Nurse Practitioner

## 2023-01-02 ENCOUNTER — Encounter: Payer: Self-pay | Admitting: Nurse Practitioner

## 2023-01-16 ENCOUNTER — Other Ambulatory Visit (HOSPITAL_COMMUNITY): Payer: Self-pay

## 2023-02-17 ENCOUNTER — Encounter (HOSPITAL_COMMUNITY): Payer: Self-pay

## 2023-02-17 ENCOUNTER — Ambulatory Visit (HOSPITAL_COMMUNITY)
Admission: EM | Admit: 2023-02-17 | Discharge: 2023-02-17 | Disposition: A | Discharge status: Home / Self Care | Payer: BC Managed Care – PPO | Attending: Physician Assistant | Admitting: Physician Assistant

## 2023-02-17 DIAGNOSIS — J0191 Acute recurrent sinusitis, unspecified: Secondary | ICD-10-CM

## 2023-02-17 MED ORDER — DOXYCYCLINE HYCLATE 100 MG PO CAPS: 100.0000 mg | ORAL_CAPSULE | Freq: Two times a day (BID) | ORAL | 0 refills | Status: AC

## 2023-02-17 MED ORDER — ALBUTEROL SULFATE HFA 108 (90 BASE) MCG/ACT IN AERS: 1.0000 | INHALATION_SPRAY | Freq: Four times a day (QID) | RESPIRATORY_TRACT | 0 refills | Status: AC | PRN

## 2023-02-17 MED ORDER — PROMETHAZINE-DM 6.25-15 MG/5ML PO SYRP: 5.0000 mL | ORAL_SOLUTION | Freq: Four times a day (QID) | ORAL | 0 refills | Status: AC | PRN

## 2023-02-17 NOTE — ED Triage Notes (Signed)
Patient here today with c/o nasal congestion, drainage, sinus pressure, ST, cough, SOB, bilat ear pain X 1 week. She has been taking Excedrin migraine and IBU with some relief.

## 2023-02-17 NOTE — ED Provider Notes (Signed)
[facility]    CSN: [id number] Arrival date & time: 02/17/23  1928      History   Chief Complaint Chief Complaint  Patient presents with   Nasal Congestion    HPI Erika Foley is a 42 y.o. female.   Pt complains of nasal congestion, sinus pressure, cough that started over 1 week ago.  She reports cough is worse at bedtime.  She reports noticing some shortness of breath earlier today.  She has no history of asthma or lung disease.  She has been taking Excedrin Migraine with minimal relief.  She is also been trying ibuprofen with minimal relief.  Denies sick contacts.  Patient denies fever, chills, nausea, vomiting.    Past Medical History:  Diagnosis Date   Acute cholecystitis due to biliary calculus 11/21/2022   Hypertension     There are no problems to display for this patient.   Past Surgical History:  Procedure Laterality Date   CHOLECYSTECTOMY N/A 11/21/2022   Procedure: LAPAROSCOPIC CHOLECYSTECTOMY;  Surgeon: Manus Rudd, MD;  Location: [facility];  Service: General;  Laterality: N/A;   INTRAOPERATIVE CHOLANGIOGRAM N/A 11/21/2022   Procedure: INTRAOPERATIVE CHOLANGIOGRAM;  Surgeon: Manus Rudd, MD;  Location: [facility];  Service: General;  Laterality: N/A;    OB History     Gravida  2   Para  2   Term  2   Preterm      AB      Living  2      SAB      IAB      Ectopic      Multiple      Live Births               Home Medications    Prior to Admission medications   Medication Sig Start Date End Date Taking? Authorizing Provider  albuterol (VENTOLIN HFA) 108 (90 Base) MCG/ACT inhaler Inhale 1-2 puffs into the lungs every 6 (six) hours as needed for wheezing or shortness of breath. 02/17/23  Yes Ward, Tylene Fantasia, PA-C  doxycycline (VIBRAMYCIN) 100 MG capsule Take 1 capsule (100 mg total) by mouth 2 (two) times daily. 02/17/23  Yes Ward, Tylene Fantasia, PA-C  esomeprazole (NEXIUM) 40 MG capsule Take 40 mg by mouth daily at 12 noon.   Yes  [provider]  lisinopril (PRINIVIL) 10 MG tablet Take 1 tablet (10 mg total) by mouth daily. 12/10/22  Yes Anyanwu, Jethro Bastos, MD  promethazine-dextromethorphan (PROMETHAZINE-DM) 6.25-15 MG/5ML syrup Take 5 mLs by mouth 4 (four) times daily as needed for cough. 02/17/23  Yes Ward, Tylene Fantasia, PA-C  acetaminophen (TYLENOL) 500 MG tablet Take 2 tablets (1,000 mg total) by mouth every 8 (eight) hours as needed. Patient not taking: Reported on 12/24/2022 11/21/22   Jacinto Halim, PA-C  megestrol (MEGACE) 40 MG tablet Take 2 tablets (80 mg total) by mouth daily. Can increase to two tablets twice a day in the event of heavy bleeding 12/10/22   Anyanwu, Jethro Bastos, MD  tranexamic acid (LYSTEDA) 650 MG TABS tablet Take 2 tablets (1,300 mg total) by mouth 3 (three) times daily. Take during menses for a maximum of five days 12/24/22   Milas Hock, MD    Family History History reviewed. No pertinent family history.  Social History Social History   Tobacco Use   Smoking status: Former   Smokeless tobacco: Never  Substance Use Topics   Alcohol use: No   Drug use: No  Allergies   Amoxicillin   Review of Systems Review of Systems  Constitutional:  Negative for chills and fever.  HENT:  Positive for congestion, sinus pressure and sinus pain. Negative for ear pain and sore throat.   Eyes:  Negative for pain and visual disturbance.  Respiratory:  Positive for cough. Negative for shortness of breath.   Cardiovascular:  Negative for chest pain and palpitations.  Gastrointestinal:  Negative for abdominal pain and vomiting.  Genitourinary:  Negative for dysuria and hematuria.  Musculoskeletal:  Negative for arthralgias and back pain.  Skin:  Negative for color change and rash.  Neurological:  Negative for seizures and syncope.  All other systems reviewed and are negative.    Physical Exam Triage Vital Signs ED Triage Vitals  Encounter Vitals Group     BP 02/17/23 2006 (!) 162/99      Systolic BP Percentile --      Diastolic BP Percentile --      Pulse Rate 02/17/23 2006 88     Resp 02/17/23 2006 16     Temp 02/17/23 2006 98 F (36.7 C)     Temp Source 02/17/23 2006 Oral     SpO2 02/17/23 2006 97 %     Weight 02/17/23 2006 217 lb (98.4 kg)     Height 02/17/23 2006 5\' 1"  (1.549 m)     Head Circumference --      Peak Flow --      Pain Score 02/17/23 2005 8     Pain Loc --      Pain Education --      Exclude from Growth Chart --    No data found.  Updated Vital Signs BP (!) 162/99 (BP Location: Left Arm)   Pulse 88   Temp 98 F (36.7 C) (Oral)   Resp 16   Ht 5\' 1"  (1.549 m)   Wt 217 lb (98.4 kg)   SpO2 97%   BMI 41.00 kg/m   Visual Acuity Right Eye Distance:   Left Eye Distance:   Bilateral Distance:    Right Eye Near:   Left Eye Near:    Bilateral Near:     Physical Exam Vitals and nursing note reviewed.  Constitutional:      General: She is not in acute distress.    Appearance: She is well-developed.  HENT:     Head: Normocephalic and atraumatic.     Nose: Congestion present.  Eyes:     Conjunctiva/sclera: Conjunctivae normal.  Cardiovascular:     Rate and Rhythm: Normal rate and regular rhythm.     Heart sounds: No murmur heard. Pulmonary:     Effort: Pulmonary effort is normal. No respiratory distress.     Breath sounds: Normal breath sounds.  Abdominal:     Palpations: Abdomen is soft.     Tenderness: There is no abdominal tenderness.  Musculoskeletal:        General: No swelling.     Cervical back: Neck supple.  Skin:    General: Skin is warm and dry.     Capillary Refill: Capillary refill takes less than 2 seconds.  Neurological:     Mental Status: She is alert.  Psychiatric:        Mood and Affect: Mood normal.      UC Treatments / Results  Labs (all labs ordered are listed, but only abnormal results are displayed) Labs Reviewed - No data to display  EKG   Radiology No results found.  Procedures  Procedures  (including critical care time)  Medications Ordered in UC Medications - No data to display  Initial Impression / Assessment and Plan / UC Course  I have reviewed the triage vital signs and the nursing notes.  Pertinent labs & imaging results that were available during my care of the patient were reviewed by me and considered in my medical decision making (see chart for details).     Will treat for acute sinusitis.  Antibiotic prescribed.  Cough syrup prescribed to take as needed.  Inhaler to use as needed.  Lungs are clear to auscultation on exam.  Patient overall well-appearing in no acute distress.  Vitals within normal limits.  Supportive care discussed.  Return precautions discussed.  Work note provided. Final Clinical Impressions(s) / UC Diagnoses   Final diagnoses:  Acute recurrent sinusitis, unspecified location     Discharge Instructions      Take antibiotic as prescribed Can use inhaler as needed for wheezing or shortness of breath Can take cough syrup as needed Recommend Mucinex and Flonase    ED Prescriptions     Medication Sig Dispense Auth. Provider   doxycycline (VIBRAMYCIN) 100 MG capsule Take 1 capsule (100 mg total) by mouth 2 (two) times daily. 20 capsule Ward, Tylene Fantasia, PA-C   albuterol (VENTOLIN HFA) 108 (90 Base) MCG/ACT inhaler Inhale 1-2 puffs into the lungs every 6 (six) hours as needed for wheezing or shortness of breath. 1 each Ward, Tylene Fantasia, PA-C   promethazine-dextromethorphan (PROMETHAZINE-DM) 6.25-15 MG/5ML syrup Take 5 mLs by mouth 4 (four) times daily as needed for cough. 118 mL Ward, Tylene Fantasia, PA-C      PDMP not reviewed this encounter.   Ward, Tylene Fantasia, PA-C 02/17/23 2025

## 2023-02-17 NOTE — Discharge Instructions (Signed)
Take antibiotic as prescribed Can use inhaler as needed for wheezing or shortness of breath Can take cough syrup as needed Recommend Mucinex and Flonase

## 2023-02-20 ENCOUNTER — Telehealth: Payer: Self-pay

## 2023-02-20 NOTE — Telephone Encounter (Signed)
Copied from CRM [id number]. Topic: Appointment Scheduling - Scheduling Inquiry for Clinic >> Feb 20, 2023  3:08 PM Lennox Pippins wrote: Raquel Sarna has called from [facility] for [facility] to follow up on a referral that was sent over for patient to get her an established PCP visit. Please advise.   [facility] for [facility] # [phone] OPTION 2 directly to [facility]

## 2023-02-21 NOTE — Telephone Encounter (Signed)
Okay for [facility] to schedule appt if pt returns call.

## 2023-02-21 NOTE — Telephone Encounter (Signed)
Looks like this is sitting in a referral wq.  It is under referral tab in chart.  I do not know how to do that.

## 2023-03-12 ENCOUNTER — Other Ambulatory Visit: Payer: Self-pay | Admitting: Obstetrics & Gynecology

## 2023-03-12 DIAGNOSIS — N920 Excessive and frequent menstruation with regular cycle: Secondary | ICD-10-CM

## 2023-03-24 DIAGNOSIS — J069 Acute upper respiratory infection, unspecified: Secondary | ICD-10-CM | POA: Diagnosis not present

## 2023-03-24 DIAGNOSIS — Z20822 Contact with and (suspected) exposure to covid-19: Secondary | ICD-10-CM | POA: Diagnosis not present

## 2023-03-27 DIAGNOSIS — J069 Acute upper respiratory infection, unspecified: Secondary | ICD-10-CM | POA: Diagnosis not present

## 2023-04-28 DIAGNOSIS — R350 Frequency of micturition: Secondary | ICD-10-CM | POA: Diagnosis not present

## 2023-04-28 DIAGNOSIS — Z20822 Contact with and (suspected) exposure to covid-19: Secondary | ICD-10-CM | POA: Diagnosis not present

## 2023-04-28 DIAGNOSIS — J069 Acute upper respiratory infection, unspecified: Secondary | ICD-10-CM | POA: Diagnosis not present

## 2023-04-28 DIAGNOSIS — M545 Low back pain, unspecified: Secondary | ICD-10-CM | POA: Diagnosis not present

## 2023-04-28 DIAGNOSIS — N390 Urinary tract infection, site not specified: Secondary | ICD-10-CM | POA: Diagnosis not present

## 2023-04-28 DIAGNOSIS — N3941 Urge incontinence: Secondary | ICD-10-CM | POA: Diagnosis not present

## 2023-05-23 DIAGNOSIS — H7291 Unspecified perforation of tympanic membrane, right ear: Secondary | ICD-10-CM | POA: Diagnosis not present

## 2023-06-09 DIAGNOSIS — J0111 Acute recurrent frontal sinusitis: Secondary | ICD-10-CM | POA: Diagnosis not present

## 2023-06-18 DIAGNOSIS — Z0489 Encounter for examination and observation for other specified reasons: Secondary | ICD-10-CM | POA: Diagnosis not present

## 2023-06-23 ENCOUNTER — Ambulatory Visit: Payer: Self-pay | Admitting: Physician Assistant

## 2023-06-23 NOTE — Progress Notes (Unsigned)
New patient visit  Patient: Erika Foley   DOB: September 01, 1980   42 y.o. Female  MRN: [id number] Visit Date: 06/23/2023  Today's healthcare provider: Debera Lat, PA-C   No chief complaint on file.  Subjective    Erika Foley is a 42 y.o. female who presents today as a new patient to establish care.  HPI  *** Discussed the use of AI scribe software for clinical note transcription with the patient, who gave verbal consent to proceed.  History of Present Illness            Past Medical History:  Diagnosis Date   Acute cholecystitis due to biliary calculus 11/21/2022   Hypertension    Past Surgical History:  Procedure Laterality Date   CHOLECYSTECTOMY N/A 11/21/2022   Procedure: LAPAROSCOPIC CHOLECYSTECTOMY;  Surgeon: Manus Rudd, MD;  Location: [facility];  Service: General;  Laterality: N/A;   INTRAOPERATIVE CHOLANGIOGRAM N/A 11/21/2022   Procedure: INTRAOPERATIVE CHOLANGIOGRAM;  Surgeon: Manus Rudd, MD;  Location: [facility] OR;  Service: General;  Laterality: N/A;   No family status information on file.   No family history on file. Social History   Socioeconomic History   Marital status: Single    Spouse name: Not on file   Number of children: Not on file   Years of education: Not on file   Highest education level: Not on file  Occupational History   Not on file  Tobacco Use   Smoking status: Former   Smokeless tobacco: Never  Substance and Sexual Activity   Alcohol use: No   Drug use: No   Sexual activity: Not on file  Other Topics Concern   Not on file  Social History Narrative   Not on file   Social Drivers of Health   Financial Resource Strain: Not on file  Food Insecurity: No Food Insecurity (11/22/2022)   Hunger Vital Sign    Worried About Running Out of Food in the Last Year: Never true    Ran Out of Food in the Last Year: Never true  Transportation Needs: No Transportation Needs (11/22/2022)   PRAPARE - Administrator, Civil Service  (Medical): No    Lack of Transportation (Non-Medical): No  Physical Activity: Not on file  Stress: Not on file  Social Connections: Not on file   Outpatient Medications Prior to Visit  Medication Sig   acetaminophen (TYLENOL) 500 MG tablet Take 2 tablets (1,000 mg total) by mouth every 8 (eight) hours as needed. (Patient not taking: Reported on 12/24/2022)   albuterol (VENTOLIN HFA) 108 (90 Base) MCG/ACT inhaler Inhale 1-2 puffs into the lungs every 6 (six) hours as needed for wheezing or shortness of breath.   doxycycline (VIBRAMYCIN) 100 MG capsule Take 1 capsule (100 mg total) by mouth 2 (two) times daily.   esomeprazole (NEXIUM) 40 MG capsule Take 40 mg by mouth daily at 12 noon.   lisinopril (PRINIVIL) 10 MG tablet Take 1 tablet (10 mg total) by mouth daily.   megestrol (MEGACE) 40 MG tablet TAKE 2 TABLETS (80 MG TOTAL) BY MOUTH DAILY. CAN INCREASE TO TWO TABLETS TWICE A DAY IN THE EVENT OF HEAVY BLEEDING   promethazine-dextromethorphan (PROMETHAZINE-DM) 6.25-15 MG/5ML syrup Take 5 mLs by mouth 4 (four) times daily as needed for cough.   tranexamic acid (LYSTEDA) 650 MG TABS tablet Take 2 tablets (1,300 mg total) by mouth 3 (three) times daily. Take during menses for a maximum of five days   No facility-administered medications prior  to visit.   Allergies  Allergen Reactions   Amoxicillin Hives     There is no immunization history on file for this patient.  Health Maintenance  Topic Date Due   HIV Screening  Never done   Hepatitis C Screening  Never done   DTaP/Tdap/Td (1 - Tdap) Never done   INFLUENZA VACCINE  Never done   COVID-19 Vaccine (1 - 2024-25 season) Never done   Cervical Cancer Screening (HPV/Pap Cotest)  12/10/2027   HPV VACCINES  Aged Out    Patient Care Team: Patient, No Pcp Per as PCP - General (General Practice)  Review of Systems  All other systems reviewed and are negative.  Except see HPI   {Insert previous labs (optional):23779} {See past labs   Heme  Chem  Endocrine  Serology  Results Review (optional):1}   Objective    There were no vitals taken for this visit. {Insert last BP/Wt (optional):23777}{See vitals history (optional):1}   Physical Exam Vitals reviewed.  Constitutional:      General: She is not in acute distress.    Appearance: Normal appearance. She is well-developed. She is not diaphoretic.  HENT:     Head: Normocephalic and atraumatic.  Eyes:     General: No scleral icterus.    Conjunctiva/sclera: Conjunctivae normal.  Neck:     Thyroid: No thyromegaly.  Cardiovascular:     Rate and Rhythm: Normal rate and regular rhythm.     Pulses: Normal pulses.     Heart sounds: Normal heart sounds. No murmur heard. Pulmonary:     Effort: Pulmonary effort is normal. No respiratory distress.     Breath sounds: Normal breath sounds. No wheezing, rhonchi or rales.  Musculoskeletal:     Cervical back: Neck supple.     Right lower leg: No edema.     Left lower leg: No edema.  Lymphadenopathy:     Cervical: No cervical adenopathy.  Skin:    General: Skin is warm and dry.     Findings: No rash.  Neurological:     Mental Status: She is alert and oriented to person, place, and time. Mental status is at baseline.  Psychiatric:        Mood and Affect: Mood normal.        Behavior: Behavior normal.     Depression Screen    12/10/2022    1:30 PM  PHQ 2/9 Scores  PHQ - 2 Score 1  PHQ- 9 Score 5   No results found for any visits on 06/23/23.  Assessment & Plan     *** Assessment and Plan              Encounter to establish care Welcomed to our clinic Reviewed past medical hx, social hx, family hx and surgical hx Pt advised to send all vaccination records or screening   No follow-ups on file.    The patient was advised to call back or seek an in-person evaluation if the symptoms worsen or if the condition fails to improve as anticipated.  I discussed the assessment and treatment plan with the  patient. The patient was provided an opportunity to ask questions and all were answered. The patient agreed with the plan and demonstrated an understanding of the instructions.  I, Debera Lat, PA-C have reviewed all documentation for this visit. The documentation on  06/23/2023   for the exam, diagnosis, procedures, and orders are all accurate and complete.  Debera Lat, [facility], MMS [facility] [phone] (  phone) [phone] (fax)  [facility]

## 2023-08-08 ENCOUNTER — Ambulatory Visit: Payer: Self-pay | Admitting: Physician Assistant

## 2023-08-26 ENCOUNTER — Ambulatory Visit: Payer: BC Managed Care – PPO | Admitting: Family Medicine

## 2023-08-26 DIAGNOSIS — L309 Dermatitis, unspecified: Secondary | ICD-10-CM | POA: Diagnosis not present

## 2023-08-26 NOTE — Progress Notes (Deleted)
 New patient visit   Patient: Erika Foley   DOB: August 08, 1980   43 y.o. Female  MRN: [id number] Visit Date: 08/26/2023  Today's healthcare provider: Ronnald Ramp, MD   No chief complaint on file.  Subjective    Erika Foley is a 43 y.o. female who presents today as a new patient to establish care.      Discussed the use of AI scribe software for clinical note transcription with the patient, who gave verbal consent to proceed.  History of Present Illness              Past Medical History:  Diagnosis Date   Acute cholecystitis due to biliary calculus 11/21/2022   Hypertension     Outpatient Medications Prior to Visit  Medication Sig   acetaminophen (TYLENOL) 500 MG tablet Take 2 tablets (1,000 mg total) by mouth every 8 (eight) hours as needed. (Patient not taking: Reported on 12/24/2022)   albuterol (VENTOLIN HFA) 108 (90 Base) MCG/ACT inhaler Inhale 1-2 puffs into the lungs every 6 (six) hours as needed for wheezing or shortness of breath.   doxycycline (VIBRAMYCIN) 100 MG capsule Take 1 capsule (100 mg total) by mouth 2 (two) times daily.   esomeprazole (NEXIUM) 40 MG capsule Take 40 mg by mouth daily at 12 noon.   lisinopril (PRINIVIL) 10 MG tablet Take 1 tablet (10 mg total) by mouth daily.   megestrol (MEGACE) 40 MG tablet TAKE 2 TABLETS (80 MG TOTAL) BY MOUTH DAILY. CAN INCREASE TO TWO TABLETS TWICE A DAY IN THE EVENT OF HEAVY BLEEDING   promethazine-dextromethorphan (PROMETHAZINE-DM) 6.25-15 MG/5ML syrup Take 5 mLs by mouth 4 (four) times daily as needed for cough.   tranexamic acid (LYSTEDA) 650 MG TABS tablet Take 2 tablets (1,300 mg total) by mouth 3 (three) times daily. Take during menses for a maximum of five days   No facility-administered medications prior to visit.    Past Surgical History:  Procedure Laterality Date   CHOLECYSTECTOMY N/A 11/21/2022   Procedure: LAPAROSCOPIC CHOLECYSTECTOMY;  Surgeon: Manus Rudd, MD;  Location: [facility] OR;   Service: General;  Laterality: N/A;   INTRAOPERATIVE CHOLANGIOGRAM N/A 11/21/2022   Procedure: INTRAOPERATIVE CHOLANGIOGRAM;  Surgeon: Manus Rudd, MD;  Location: [facility] OR;  Service: General;  Laterality: N/A;   No family status information on file.   No family history on file. Social History   Socioeconomic History   Marital status: Single    Spouse name: Not on file   Number of children: Not on file   Years of education: Not on file   Highest education level: Not on file  Occupational History   Not on file  Tobacco Use   Smoking status: Former   Smokeless tobacco: Never  Substance and Sexual Activity   Alcohol use: No   Drug use: No   Sexual activity: Not on file  Other Topics Concern   Not on file  Social History Narrative   Not on file   Social Drivers of Health   Financial Resource Strain: Not on file  Food Insecurity: No Food Insecurity (11/22/2022)   Hunger Vital Sign    Worried About Running Out of Food in the Last Year: Never true    Ran Out of Food in the Last Year: Never true  Transportation Needs: No Transportation Needs (11/22/2022)   PRAPARE - Administrator, Civil Service (Medical): No    Lack of Transportation (Non-Medical): No  Physical Activity: Not on file  Stress: Not on file  Social Connections: Not on file     Allergies  Allergen Reactions   Amoxicillin Hives     There is no immunization history on file for this patient.  Health Maintenance  Topic Date Due   COVID-19 Vaccine (1) Never done   HIV Screening  Never done   Hepatitis C Screening  Never done   DTaP/Tdap/Td (1 - Tdap) Never done   INFLUENZA VACCINE  Never done   Cervical Cancer Screening (HPV/Pap Cotest)  12/10/2027   HPV VACCINES  Aged Out    Patient Care Team: Patient, No Pcp Per as PCP - General (General Practice)  Review of Systems  {Insert previous labs (optional):23779} {See past labs  Heme  Chem  Endocrine  Serology  Results Review  (optional):1}   Objective    There were no vitals taken for this visit. {Insert last BP/Wt (optional):23777}{See vitals history (optional):1}    Depression Screen    12/10/2022    1:30 PM  PHQ 2/9 Scores  PHQ - 2 Score 1  PHQ- 9 Score 5   No results found for any visits on 08/26/23.   Physical Exam ***    Assessment & Plan      Problem List Items Addressed This Visit   None   Assessment and Plan               No follow-ups on file.      Ronnald Ramp, MD  [facility] [phone] (phone) [phone] (fax)  [facility]

## 2023-09-29 DIAGNOSIS — I10 Essential (primary) hypertension: Secondary | ICD-10-CM | POA: Diagnosis not present

## 2023-11-24 DIAGNOSIS — R030 Elevated blood-pressure reading, without diagnosis of hypertension: Secondary | ICD-10-CM | POA: Diagnosis not present

## 2023-11-24 DIAGNOSIS — T7840XA Allergy, unspecified, initial encounter: Secondary | ICD-10-CM | POA: Diagnosis not present

## 2023-11-24 DIAGNOSIS — G43909 Migraine, unspecified, not intractable, without status migrainosus: Secondary | ICD-10-CM | POA: Diagnosis not present

## 2023-12-23 DIAGNOSIS — S161XXA Strain of muscle, fascia and tendon at neck level, initial encounter: Secondary | ICD-10-CM | POA: Diagnosis not present

## 2023-12-23 DIAGNOSIS — L309 Dermatitis, unspecified: Secondary | ICD-10-CM | POA: Diagnosis not present

## 2024-02-02 DIAGNOSIS — U071 COVID-19: Secondary | ICD-10-CM | POA: Diagnosis not present

## 2024-02-02 DIAGNOSIS — Z20822 Contact with and (suspected) exposure to covid-19: Secondary | ICD-10-CM | POA: Diagnosis not present

## 2024-02-05 DIAGNOSIS — U071 COVID-19: Secondary | ICD-10-CM | POA: Diagnosis not present

## 2024-02-05 DIAGNOSIS — Z1152 Encounter for screening for COVID-19: Secondary | ICD-10-CM | POA: Diagnosis not present

## 2024-02-24 ENCOUNTER — Ambulatory Visit (INDEPENDENT_AMBULATORY_CARE_PROVIDER_SITE_OTHER): Admitting: Obstetrics & Gynecology

## 2024-02-24 ENCOUNTER — Encounter: Payer: Self-pay | Admitting: Obstetrics & Gynecology

## 2024-02-24 ENCOUNTER — Other Ambulatory Visit: Payer: Self-pay | Admitting: Obstetrics & Gynecology

## 2024-02-24 VITALS — BP 180/105 | HR 96 | Wt 251.0 lb

## 2024-02-24 DIAGNOSIS — N920 Excessive and frequent menstruation with regular cycle: Secondary | ICD-10-CM

## 2024-02-24 DIAGNOSIS — Z30432 Encounter for removal of intrauterine contraceptive device: Secondary | ICD-10-CM

## 2024-02-24 DIAGNOSIS — R32 Unspecified urinary incontinence: Secondary | ICD-10-CM | POA: Diagnosis not present

## 2024-02-24 MED ORDER — TRANEXAMIC ACID 650 MG PO TABS: 1300.0000 mg | ORAL_TABLET | Freq: Three times a day (TID) | ORAL | 2 refills | Status: DC

## 2024-02-24 MED ORDER — MEGESTROL ACETATE 40 MG PO TABS: 80.0000 mg | ORAL_TABLET | Freq: Every day | ORAL | 3 refills | Status: DC

## 2024-02-24 NOTE — Patient Instructions (Addendum)
 [facility] at [facility] Address: [address] [place], [place], [place] [address] Phone: [phone]

## 2024-02-24 NOTE — Progress Notes (Addendum)
 GYNECOLOGY OFFICE VISIT NOTE  History:  Erika Foley is a 43 y.o. [phone] here today for Mirena  removal.  Had this placed in June 2024, has gained about 30 pounds since then which she attributes to the IUD.  Wants hysterectomy for AUB management.  Also reports worsening urinary incontinence, feels like she has to go all the time, and sometimes does not make it to the bathroom.  Wants surgery to fix this too.  She has changed her diet and exercise and trying to lose weight.  She denies any abnormal vaginal discharge, bleeding, pelvic pain or other concerns.  Past Medical History:  Diagnosis Date   Acute cholecystitis due to biliary calculus 11/21/2022   Hypertension     Past Surgical History:  Procedure Laterality Date   CHOLECYSTECTOMY N/A 11/21/2022   Procedure: LAPAROSCOPIC CHOLECYSTECTOMY;  Surgeon: Belinda Cough, MD;  Location: [facility] OR;  Service: General;  Laterality: N/A;   INTRAOPERATIVE CHOLANGIOGRAM N/A 11/21/2022   Procedure: INTRAOPERATIVE CHOLANGIOGRAM;  Surgeon: Belinda Cough, MD;  Location: [facility] OR;  Service: General;  Laterality: N/A;    The following portions of the patient's history were reviewed and updated as appropriate: allergies, current medications, past family history, past medical history, past social history, past surgical history and problem list.   Health Maintenance:  Normal pap and negative HRHPV on 12/10/2022.   Review of Systems:  Pertinent items noted in HPI and remainder of comprehensive ROS otherwise negative.  Physical Exam:  BP (!) 180/105   Pulse 96   Wt 251 lb (113.9 kg)   BMI 47.43 kg/m  CONSTITUTIONAL: Well-developed, well-nourished female in no acute distress.  HEENT:  Normocephalic, atraumatic. External right and left ear normal. No scleral icterus.  NECK: Normal range of motion, supple, no masses noted on observation SKIN: No rash noted. Not diaphoretic. No erythema. No pallor. MUSCULOSKELETAL: Normal range of motion. No edema  noted. NEUROLOGIC: Alert and oriented to person, place, and time. Normal muscle tone coordination. No cranial nerve deficit noted on observation. PSYCHIATRIC: Normal mood and affect. Normal behavior. Normal judgment and thought content. CARDIOVASCULAR: Normal heart rate noted RESPIRATORY: Effort and breath sounds normal, no problems with respiration noted ABDOMEN: No masses or other overt distention noted on observation. No tenderness.   PELVIC: Deferred  IUD Removal  Patient identified, informed consent performed, consent signed.   Chaperone present.  Patient was placed in the dorsal lithotomy position, normal external genitalia was noted.  A speculum was placed in the patient's vagina, normal discharge was noted, no lesions. The cervix was visualized, no lesions, no abnormal discharge.  The strings of the IUD were grasped and pulled using ring forceps. The Mirena   IUD was removed in its entirety.  Patient tolerated the procedure well.     Assessment and Plan:     1. Morbid obesity (HCC) Congratulated on weight loss efforts, advised to continue. Also referred to Medical Weight Management for further evaluation and help. She is aware that weight loss can help her incontinence and other symptoms. - Amb Ref to Medical Weight Management  3. Urinary incontinence in female Referred to Urogynecology for further evaluation and management. - Ambulatory referral to Urogynecology  2. Menorrhagia with regular cycle Patient desires hysterectomy, was told that this can worsen incontinence/cause pelvic organ prolapse if not done with a specialist who can also address these issues.  After she is evaluated by Urogynecology, a plan can be made for surgery. In the meantime, she wants to continue Lysteda . Bleeding precautions reviewed.  Optimization of her BP and rest of her health prior to surgery emphasized. - tranexamic acid  (LYSTEDA ) 650 MG TABS tablet; Take 2 tablets (1,300 mg total) by mouth 3 (three)  times daily. Take during menses for a maximum of five days  Dispense: 30 tablet; Refill: 2  4. Encounter for IUD removal (Primary) IUD successfully removed.   Routine preventative health maintenance measures emphasized. Please refer to After Visit Summary for other counseling recommendations.   Return for any gynecologic concerns.    I spent 45 minutes dedicated to the care of this patient including pre-visit review of records, face to face time with the patient discussing her conditions and treatments, post visit ordering of medications and appropriate tests or procedures, coordinating care and documenting this visit encounter.    GLORIS HUGGER, MD, FACOG Obstetrician & Gynecologist, [facility] [employer], [facility]

## 2024-02-24 NOTE — Telephone Encounter (Signed)
Please help with this

## 2024-02-27 DIAGNOSIS — N390 Urinary tract infection, site not specified: Secondary | ICD-10-CM | POA: Diagnosis not present

## 2024-03-01 DIAGNOSIS — R102 Pelvic and perineal pain: Secondary | ICD-10-CM | POA: Diagnosis not present

## 2024-03-01 DIAGNOSIS — N200 Calculus of kidney: Secondary | ICD-10-CM | POA: Diagnosis not present

## 2024-03-01 DIAGNOSIS — N202 Calculus of kidney with calculus of ureter: Secondary | ICD-10-CM | POA: Diagnosis not present

## 2024-03-01 DIAGNOSIS — M549 Dorsalgia, unspecified: Secondary | ICD-10-CM | POA: Diagnosis not present

## 2024-03-01 DIAGNOSIS — K5730 Diverticulosis of large intestine without perforation or abscess without bleeding: Secondary | ICD-10-CM | POA: Diagnosis not present

## 2024-03-01 DIAGNOSIS — R109 Unspecified abdominal pain: Secondary | ICD-10-CM | POA: Diagnosis not present

## 2024-03-01 DIAGNOSIS — N201 Calculus of ureter: Secondary | ICD-10-CM | POA: Diagnosis not present

## 2024-03-01 DIAGNOSIS — D72829 Elevated white blood cell count, unspecified: Secondary | ICD-10-CM | POA: Diagnosis not present

## 2024-03-01 DIAGNOSIS — R200 Anesthesia of skin: Secondary | ICD-10-CM | POA: Diagnosis not present

## 2024-03-01 DIAGNOSIS — I10 Essential (primary) hypertension: Secondary | ICD-10-CM | POA: Diagnosis not present

## 2024-03-01 MED ORDER — TRANEXAMIC ACID 650 MG PO TABS
1300.0000 mg | ORAL_TABLET | Freq: Three times a day (TID) | ORAL | 2 refills | Status: AC
Start: 2024-03-01 — End: ?

## 2024-03-01 NOTE — Addendum Note (Signed)
 Addended by: HERCHEL GRUMET A on: 03/01/2024 08:20 AM   Modules accepted: Orders

## 2024-03-02 DIAGNOSIS — R102 Pelvic and perineal pain: Secondary | ICD-10-CM | POA: Diagnosis not present

## 2024-03-02 DIAGNOSIS — K5730 Diverticulosis of large intestine without perforation or abscess without bleeding: Secondary | ICD-10-CM | POA: Diagnosis not present

## 2024-03-02 DIAGNOSIS — N201 Calculus of ureter: Secondary | ICD-10-CM | POA: Diagnosis not present

## 2024-03-18 ENCOUNTER — Encounter (INDEPENDENT_AMBULATORY_CARE_PROVIDER_SITE_OTHER): Payer: Self-pay

## 2024-04-14 DIAGNOSIS — I998 Other disorder of circulatory system: Secondary | ICD-10-CM | POA: Diagnosis not present

## 2024-04-15 DIAGNOSIS — I10 Essential (primary) hypertension: Secondary | ICD-10-CM | POA: Diagnosis not present

## 2024-05-03 DIAGNOSIS — I10 Essential (primary) hypertension: Secondary | ICD-10-CM | POA: Diagnosis not present

## 2024-05-03 DIAGNOSIS — J0100 Acute maxillary sinusitis, unspecified: Secondary | ICD-10-CM | POA: Diagnosis not present

## 2024-06-11 ENCOUNTER — Ambulatory Visit: Admitting: Obstetrics
